# Patient Record
Sex: Male | Born: 1955 | Race: White | Hispanic: No | Marital: Married | Smoking: Never smoker
Health system: Southern US, Community
[De-identification: ages and names within clinical notes are randomized; demographics above are authoritative.]

## PROBLEM LIST (undated history)

## (undated) DIAGNOSIS — R519 Headache, unspecified: Secondary | ICD-10-CM

## (undated) DIAGNOSIS — Z87442 Personal history of urinary calculi: Secondary | ICD-10-CM

## (undated) DIAGNOSIS — N529 Male erectile dysfunction, unspecified: Secondary | ICD-10-CM

## (undated) DIAGNOSIS — M19041 Primary osteoarthritis, right hand: Secondary | ICD-10-CM

## (undated) DIAGNOSIS — E119 Type 2 diabetes mellitus without complications: Secondary | ICD-10-CM

## (undated) DIAGNOSIS — M48061 Spinal stenosis, lumbar region without neurogenic claudication: Secondary | ICD-10-CM

## (undated) DIAGNOSIS — G629 Polyneuropathy, unspecified: Secondary | ICD-10-CM

## (undated) DIAGNOSIS — I10 Essential (primary) hypertension: Secondary | ICD-10-CM

## (undated) DIAGNOSIS — E538 Deficiency of other specified B group vitamins: Secondary | ICD-10-CM

## (undated) DIAGNOSIS — M489 Spondylopathy, unspecified: Secondary | ICD-10-CM

## (undated) DIAGNOSIS — R739 Hyperglycemia, unspecified: Secondary | ICD-10-CM

## (undated) DIAGNOSIS — M19042 Primary osteoarthritis, left hand: Secondary | ICD-10-CM

## (undated) HISTORY — DX: Polyneuropathy, unspecified: G62.9

## (undated) HISTORY — DX: Primary osteoarthritis, right hand: M19.041

## (undated) HISTORY — DX: Hyperglycemia, unspecified: R73.9

## (undated) HISTORY — DX: Primary osteoarthritis, right hand: M19.042

## (undated) HISTORY — DX: Male erectile dysfunction, unspecified: N52.9

## (undated) HISTORY — DX: Type 2 diabetes mellitus without complications: E11.9

## (undated) HISTORY — DX: Deficiency of other specified B group vitamins: E53.8

## (undated) HISTORY — DX: Spondylopathy, unspecified: M48.9

## (undated) HISTORY — DX: Spinal stenosis, lumbar region without neurogenic claudication: M48.061

---

## 1998-12-07 ENCOUNTER — Encounter: Payer: Self-pay | Admitting: Emergency Medicine

## 1998-12-07 ENCOUNTER — Emergency Department (HOSPITAL_COMMUNITY): Admission: EM | Admit: 1998-12-07 | Discharge: 1998-12-07 | Payer: Self-pay | Admitting: Emergency Medicine

## 2008-08-27 HISTORY — PX: ANKLE SURGERY: SHX546

## 2008-08-30 ENCOUNTER — Other Ambulatory Visit: Payer: Self-pay

## 2008-08-30 ENCOUNTER — Ambulatory Visit: Payer: Self-pay

## 2011-07-22 ENCOUNTER — Emergency Department (HOSPITAL_COMMUNITY)
Admission: EM | Admit: 2011-07-22 | Discharge: 2011-07-22 | Disposition: A | Discharge status: Home / Self Care | Payer: PRIVATE HEALTH INSURANCE | Attending: Emergency Medicine | Admitting: Emergency Medicine

## 2011-07-22 ENCOUNTER — Emergency Department (HOSPITAL_COMMUNITY): Payer: PRIVATE HEALTH INSURANCE

## 2011-07-22 ENCOUNTER — Encounter (HOSPITAL_COMMUNITY): Payer: Self-pay | Admitting: Radiology

## 2011-07-22 DIAGNOSIS — K219 Gastro-esophageal reflux disease without esophagitis: Secondary | ICD-10-CM | POA: Insufficient documentation

## 2011-07-22 DIAGNOSIS — K5730 Diverticulosis of large intestine without perforation or abscess without bleeding: Secondary | ICD-10-CM | POA: Insufficient documentation

## 2011-07-22 DIAGNOSIS — R110 Nausea: Secondary | ICD-10-CM | POA: Insufficient documentation

## 2011-07-22 DIAGNOSIS — N200 Calculus of kidney: Secondary | ICD-10-CM | POA: Insufficient documentation

## 2011-07-22 DIAGNOSIS — I10 Essential (primary) hypertension: Secondary | ICD-10-CM | POA: Insufficient documentation

## 2011-07-22 DIAGNOSIS — R61 Generalized hyperhidrosis: Secondary | ICD-10-CM | POA: Insufficient documentation

## 2011-07-22 DIAGNOSIS — R1031 Right lower quadrant pain: Secondary | ICD-10-CM | POA: Insufficient documentation

## 2011-07-22 DIAGNOSIS — R109 Unspecified abdominal pain: Secondary | ICD-10-CM | POA: Insufficient documentation

## 2011-07-22 HISTORY — DX: Essential (primary) hypertension: I10

## 2011-07-22 LAB — URINALYSIS, ROUTINE W REFLEX MICROSCOPIC
Bilirubin Urine: NEGATIVE
Glucose, UA: NEGATIVE mg/dL
Ketones, ur: 15 mg/dL — AB
Leukocytes, UA: NEGATIVE
Nitrite: NEGATIVE
Protein, ur: 30 mg/dL — AB
Specific Gravity, Urine: 1.026 (ref 1.005–1.030)
Urobilinogen, UA: 0.2 mg/dL (ref 0.0–1.0)
pH: 5 (ref 5.0–8.0)

## 2011-07-22 LAB — CBC
HCT: 43.3 % (ref 39.0–52.0)
Hemoglobin: 15.1 g/dL (ref 13.0–17.0)
MCH: 30.1 pg (ref 26.0–34.0)
MCHC: 34.9 g/dL (ref 30.0–36.0)
MCV: 86.4 fL (ref 78.0–100.0)
Platelets: 222 10*3/uL (ref 150–400)
RBC: 5.01 MIL/uL (ref 4.22–5.81)
RDW: 12.9 % (ref 11.5–15.5)
WBC: 10.3 10*3/uL (ref 4.0–10.5)

## 2011-07-22 LAB — BASIC METABOLIC PANEL
BUN: 18 mg/dL (ref 6–23)
CO2: 22 mEq/L (ref 19–32)
Calcium: 9.5 mg/dL (ref 8.4–10.5)
Chloride: 105 mEq/L (ref 96–112)
Creatinine, Ser: 1.05 mg/dL (ref 0.50–1.35)
GFR calc Af Amer: >60 mL/min (ref >60)
GFR calc non Af Amer: >60 mL/min (ref >60)
Glucose, Bld: 86 mg/dL (ref 70–99)
Potassium: 4.2 mEq/L (ref 3.5–5.1)
Sodium: 137 mEq/L (ref 135–145)

## 2011-07-22 LAB — URINE MICROSCOPIC-ADD ON

## 2015-12-28 HISTORY — PX: CARPAL TUNNEL RELEASE: SHX101

## 2017-03-28 ENCOUNTER — Telehealth: Payer: Self-pay | Admitting: Neurology

## 2017-03-28 NOTE — Telephone Encounter (Signed)
Patient's wife stated they cancelled the apt in error 4/4 when the automated system called them. Gave them the next available but they would like a call back to get in sooner if someone cancels. Best number is [phone]

## 2017-03-29 NOTE — Telephone Encounter (Signed)
Returned call to patient - his appt has been scheduled to 03/31/17.

## 2017-03-30 ENCOUNTER — Ambulatory Visit: Payer: BLUE CROSS/BLUE SHIELD | Admitting: Neurology

## 2017-03-31 ENCOUNTER — Encounter: Payer: Self-pay | Admitting: *Deleted

## 2017-03-31 ENCOUNTER — Ambulatory Visit (INDEPENDENT_AMBULATORY_CARE_PROVIDER_SITE_OTHER): Payer: BLUE CROSS/BLUE SHIELD | Admitting: Neurology

## 2017-03-31 ENCOUNTER — Encounter: Payer: Self-pay | Admitting: Neurology

## 2017-03-31 ENCOUNTER — Encounter (INDEPENDENT_AMBULATORY_CARE_PROVIDER_SITE_OTHER): Payer: Self-pay

## 2017-03-31 DIAGNOSIS — R202 Paresthesia of skin: Secondary | ICD-10-CM

## 2017-03-31 DIAGNOSIS — R269 Unspecified abnormalities of gait and mobility: Secondary | ICD-10-CM | POA: Diagnosis not present

## 2017-03-31 DIAGNOSIS — M542 Cervicalgia: Secondary | ICD-10-CM

## 2017-03-31 DIAGNOSIS — M5442 Lumbago with sciatica, left side: Secondary | ICD-10-CM

## 2017-03-31 DIAGNOSIS — M545 Low back pain, unspecified: Secondary | ICD-10-CM | POA: Insufficient documentation

## 2017-03-31 DIAGNOSIS — G8929 Other chronic pain: Secondary | ICD-10-CM

## 2017-03-31 MED ORDER — DULOXETINE HCL 60 MG PO CPEP: 60.0000 mg | ORAL_CAPSULE | Freq: Every day | ORAL | 11 refills | Status: DC

## 2017-03-31 NOTE — Progress Notes (Signed)
PATIENT: Eric Nixon DOB: 09/08/1956  Chief Complaint  Patient presents with  . New Patient (Initial Visit)    Rm 4. Patient states that he has joint and muscle pain for years. recently has become worse. Patient is on the last day of taking steroids.      HISTORICAL  Eric Nixon is a 61 years old right-handed male, seen in refer by his primary care physician Dr.  Georgianne Nixon for evaluation of diffuse body muscle and joints pain, initial evaluation was on March 31 2017.  I reviewed and summarized the referring note, he had a history of obesity, hypertension, had a history of fall with left ankle fracture, require surgical fixation in 2009.  He works at [employer], did a lot of heavy lifting, around 2009 he also noticed gradual worsening low back pain, intermittent flareup of radiating pain to his left lower extremity, over the years he has increased the left ankle stiffness, left lateral 3 toes curled underneath, deformity of left foot, gradual onset mild gait abnormality,  In 2017 he had bilateral carpal tunnel release surgery due to first 3 finger paresthesia bilaterally, but the surgery did not help his symptoms much, he continues to have hands numbness, neck pain, radiating pain to bilateral shoulder, left shoulder pain, limited range of motion of left shoulder, difficulty buttoning due to clumsiness of both hands,  He denies bowel and bladder incontinence, mild gait abnormality, he also reported transient vertigo when he hyperextended his neck,  He was given a prescription of prednisone starting at 40 mg each day, reduce 10 mg every 3 days,He was also treated with diclofenac without significant improvement.  REVIEW OF SYSTEMS: Full 14 system review of systems performed and notable only for fatigue, snoring, joint pain, achy muscles, numbness, weakness, decreased energy  ALLERGIES: Allergies  Allergen Reactions  . Gabapentin Other (See Comments)    Causes  irritable mood    HOME MEDICATIONS: Current Outpatient Prescriptions  Medication Sig Dispense Refill  . diclofenac (VOLTAREN) 75 MG EC tablet Take 75 mg by mouth 2 (two) times daily.  3  . lisinopril-hydrochlorothiazide (PRINZIDE,ZESTORETIC) 20-12.5 MG tablet Take 1 tablet by mouth daily.  2   No current facility-administered medications for this visit.     PAST MEDICAL HISTORY: Past Medical History:  Diagnosis Date  . B12 deficiency   . ED (erectile dysfunction)   . Hyperglycemia   . Hypertension   . Osteoarthritis of both hands   . Peripheral neuropathy (HCC)     PAST SURGICAL HISTORY: Past Surgical History:  Procedure Laterality Date  . ANKLE SURGERY  08/2008    FAMILY HISTORY: Family History  Problem Relation Age of Onset  . Brain cancer Mother   . Bladder Cancer Father     SOCIAL HISTORY:  Social History   Social History  . Marital status: Married    Spouse name: N/A  . Number of children: 2  . Years of education: HS   Occupational History  . Owns Materials engineer   . Owns apartment   . Runs auction house    Social History Main Topics  . Smoking status: Never Smoker  . Smokeless tobacco: Never Used  . Alcohol use Yes     Comment: Rare  . Drug use: No  . Sexual activity: Not on file   Other Topics Concern  . Not on file   Social History Narrative   Lives at home with his wife.   Right-handed.  1 cup caffeine per day.     PHYSICAL EXAM   Vitals:   03/31/17 0858  BP: (!) 150/107  Pulse: 84  Resp: 16  Weight: 253 lb (114.8 kg)  Height:  (1.778 m)    Not recorded      Body mass index is 36.3 kg/m.  PHYSICAL EXAMNIATION:  Gen: NAD, conversant, well nourised, obese, well groomed                     Cardiovascular: Regular rate rhythm, no peripheral edema, warm, nontender. Eyes: Conjunctivae clear without exudates or hemorrhage Neck: Supple, no carotid bruits. Pulmonary: Clear to auscultation bilaterally   NEUROLOGICAL  EXAM:  MENTAL STATUS: Speech:    Speech is normal; fluent and spontaneous with normal comprehension.  Cognition:     Orientation to time, place and person     Normal recent and remote memory     Normal Attention span and concentration     Normal Language, naming, repeating,spontaneous speech     Fund of knowledge   CRANIAL NERVES: CN II: Visual fields are full to confrontation. Fundoscopic exam is normal with sharp discs and no vascular changes. Pupils are round equal and briskly reactive to light. CN III, IV, VI: extraocular movement are normal. No ptosis. CN V: Facial sensation is intact to pinprick in all 3 divisions bilaterally. Corneal responses are intact.  CN VII: Face is symmetric with normal eye closure and smile. CN VIII: Hearing is normal to rubbing fingers CN IX, X: Palate elevates symmetrically. Phonation is normal. CN XI: Head turning and shoulder shrug are intact CN XII: Tongue is midline with normal movements and no atrophy.  MOTOR: He has deformity of left foot, mild diffuse left toe extension flexion weakness, limited range of motion of left ankle, but there was no significant weakness of bilateral lower extremity, left upper extremity examination is limited due to left shoulder pain, mild left finger abduction weakness.  REFLEXES: Reflexes are 2+ and symmetric at the biceps, triceps, knees, and  absent at ankles. Plantar responses are flexor.  SENSORY: Decreased vibratory sensation at bilateral toes, length dependent decreased light touch pinprick to above ankle level  COORDINATION: Rapid alternating movements and fine finger movements are intact. There is no dysmetria on finger-to-nose and heel-knee-shin.    GAIT/STANCE: He needs push up to get up from seated position, dragging left leg some, difficulty standing up on left heels,   DIAGNOSTIC DATA (LABS, IMAGING, TESTING) - I reviewed patient records, labs, notes, testing and imaging myself where  available.   ASSESSMENT AND PLAN  Eric Nixon is a 61 y.o. male   Chronic low back pain radiating pain to left lower extremity  Consistent with left lumbar radiculopathy Chronic neck pain, continued bilateral hand paresthesia, left hand weakness despite bilateral carpal tunnel release surgery  Consistent with cervical radiculopathy  Proceed with MRI of cervical spine, lumbar spine  EMG nerve conduction study  Try to use NSAIDs only as needed, add on Cymbalta 60 mg daily   Levert Feinstein, M.D. Ph.D.  [facility] [address], [address] [place], [place] [address] Ph: [phone] Fax: [phone]  CC: Eric Fick, MD

## 2017-04-11 ENCOUNTER — Ambulatory Visit: Payer: BLUE CROSS/BLUE SHIELD | Admitting: Neurology

## 2017-04-13 ENCOUNTER — Ambulatory Visit (INDEPENDENT_AMBULATORY_CARE_PROVIDER_SITE_OTHER): Payer: BLUE CROSS/BLUE SHIELD

## 2017-04-13 DIAGNOSIS — R202 Paresthesia of skin: Secondary | ICD-10-CM | POA: Diagnosis not present

## 2017-04-13 DIAGNOSIS — R269 Unspecified abnormalities of gait and mobility: Secondary | ICD-10-CM | POA: Diagnosis not present

## 2017-04-13 DIAGNOSIS — M542 Cervicalgia: Secondary | ICD-10-CM

## 2017-04-13 DIAGNOSIS — G8929 Other chronic pain: Secondary | ICD-10-CM | POA: Diagnosis not present

## 2017-04-13 DIAGNOSIS — M5442 Lumbago with sciatica, left side: Secondary | ICD-10-CM

## 2017-04-20 ENCOUNTER — Telehealth: Payer: Self-pay | Admitting: Neurology

## 2017-04-20 NOTE — Telephone Encounter (Signed)
Please call patient MRI of the cervical spine showed multilevel degenerative changes, with evidence of foraminal stenosis most severe at C6-7, C7-T1, left C5-6, left C3-4,   MRI of lumbar spine also showed evidence of multilevel degenerative disc disease, with evidence of severe spinal stenosis at L 3-4, L 4 5, I will review MRI findings with him in detail at his next follow-up visit   Abnormal MRI cervical spine (without) demonstrating: 1. At C6-7, C7-T1: uncovertebral joint hypertrophy and facet hypertrophy with severe biforaminal stenosis  2. At C5-6: uncovertebral joint hypertrophy and facet hypertrophy with severe left foraminal stenosis  3. At C3-4: left facet hypertrophy with severe left foraminal stenosis 4. At C4-5: uncovertebral joint hypertrophy with mild left foraminal stenosis   IMPRESSION:  Abnormal MRI lumbar spine (without) demonstrating: 1. At L3-4, L4-5: disc bulging and facet hypertrophy / arthropathy with severe spinal stenosis and severe biforaminal stenosis  2. At L2-3: disc bulging and facet hypertrophy with moderate spinal stenosis and mild biforaminal stenosis  3. At L1-2: disc bulging and facet hypertrophy with mild spinal stenosis and mild biforaminal stenosis  4. At L3 and L4 there is anterior vertebral body edema. Posterior interspinous ligament edema at L3 and L4.

## 2017-04-21 NOTE — Telephone Encounter (Signed)
Spoke to patient - he is aware of results and will keep his appt on 05/06/17 for his NCV/EMG.

## 2017-04-21 NOTE — Telephone Encounter (Signed)
Left messages at both listed numbers requesting a return call.

## 2017-05-06 ENCOUNTER — Encounter (INDEPENDENT_AMBULATORY_CARE_PROVIDER_SITE_OTHER): Payer: Self-pay

## 2017-05-06 ENCOUNTER — Encounter (INDEPENDENT_AMBULATORY_CARE_PROVIDER_SITE_OTHER): Payer: Self-pay | Admitting: Neurology

## 2017-05-06 ENCOUNTER — Ambulatory Visit (INDEPENDENT_AMBULATORY_CARE_PROVIDER_SITE_OTHER): Payer: BLUE CROSS/BLUE SHIELD | Admitting: Neurology

## 2017-05-06 DIAGNOSIS — M5442 Lumbago with sciatica, left side: Secondary | ICD-10-CM

## 2017-05-06 DIAGNOSIS — R269 Unspecified abnormalities of gait and mobility: Secondary | ICD-10-CM | POA: Diagnosis not present

## 2017-05-06 DIAGNOSIS — M48061 Spinal stenosis, lumbar region without neurogenic claudication: Secondary | ICD-10-CM | POA: Insufficient documentation

## 2017-05-06 DIAGNOSIS — M542 Cervicalgia: Secondary | ICD-10-CM | POA: Diagnosis not present

## 2017-05-06 DIAGNOSIS — G8929 Other chronic pain: Secondary | ICD-10-CM | POA: Diagnosis not present

## 2017-05-06 DIAGNOSIS — R202 Paresthesia of skin: Secondary | ICD-10-CM | POA: Diagnosis not present

## 2017-05-06 DIAGNOSIS — Z0289 Encounter for other administrative examinations: Secondary | ICD-10-CM

## 2017-05-06 MED ORDER — CELECOXIB 100 MG PO CAPS: 100.0000 mg | ORAL_CAPSULE | Freq: Two times a day (BID) | ORAL | 11 refills | Status: DC

## 2017-05-06 NOTE — Patient Instructions (Signed)
[email or web address]   [address] [place], [place] [address] Phone: [phone]

## 2017-05-06 NOTE — Procedures (Signed)
Full Name: Eric GlatterJoel Bonnet Gender: Male MRN #: [id number] Date of Birth: 05-06-1956    Visit Date: 05/06/2017 08:25 Age: [age] Years 8 Months Old Examining Physician: Levert FeinsteinYijun Athziry Millican, MD  Referring Physician: Terrace ArabiaYan, MD History of present illness: 61 years old male, with history of chronic low back pain, neck pain, radiating to bilateral shoulder, history of bilateral carpal tunnel release surgery.  Summary of the tests:  Nerve conduction study:  Bilateral sural and superficial peroneal sensory responses showed borderline peak latency, normal snap amplitude. Bilateral ulnar sensory and motor responses were normal. Right median sensory responses were absent. Left median sensory response showed moderately to severely prolonged peak latency, with decreased snap amplitude. Bilateral radial sensory responses were normal.  Right median motor responses were absent. Left median motor responses showed mildly prolonged distal latency, with normal C map amplitude, conduction velocity.   Bilateral peroneal motor responses were normal. Left foot has deformity, has severely decreased left tibial C map amplitude. Right tibial motor responses showed mildly prolonged distal latency, with the normal range C map amplitude, and mildly decreased conduction velocity.  Electromyography:  Selective needle examinations were performed at bilateral upper and lower extremity muscles, bilateral cervical and lumbar sacral paraspinal muscles.  There is evidence of chronic neuropathic changes involving bilateral tibialis anterior, tibialis posterior, peroneal longus, medial gastrocnemius, vastus lateralis, left worse than right, there is also evidence of mild chronic neuropathic changes involving bilateral biceps, deltoid. There is evidence of chronic neuropathic changes involving bilateral abductor pollicis brevis, but there was no evidence of active generations.  Conclusion: This is an abnormal study. There is  electrodiagnostic evidence of chronic bilateral lumbosacral radiculopathy, mainly involving bilateral L4 L5 S1 myotomes, left worse than right. There is also evidence of chronic bilateral cervical radiculopathy, mainly involving bilateral C5-6 mild tones. There is bilateral carpal tunnel syndromes, severe on the left side, moderately severe on the right side, demyelinating in nature, there was no evidence of axonal loss.     ------------------------------- Levert FeinsteinYijun Shandelle Borrelli, M.D.  [facility] [address] [place], [place] [address] Tel: [phone] Fax: [phone]        [facility]    Nerve / Sites Rec. Site Latency Ref. Amplitude Ref. Rel Amp Segments Distance Velocity Ref. Area    ms ms mV mV %  cm m/s m/s mVms  L Median - APB     Wrist APB 5.1 ?4.4 6.2 ?4.0 100 Wrist - APB 7   21.4     Upper arm APB 9.8  5.5  89.2 Upper arm - Wrist 24 50 ?49 20.1  R Median - APB     Wrist APB NR ?4.4 NR ?4.0 NR Wrist - APB 7   NR     Upper arm APB NR  NR  NR Upper arm - Wrist   ?49 NR  L Ulnar - ADM     Wrist ADM 2.9 ?3.3 10.9 ?6.0 100 Wrist - ADM 7   36.9     B.Elbow ADM 7.4  9.9  90.9 B.Elbow - Wrist 22 48 ?49 35.2     A.Elbow ADM 10.1  9.6  97.3 A.Elbow - B.Elbow 12 45 ?49 34.5         A.Elbow - Wrist      R Ulnar - ADM     Wrist ADM 3.0 ?3.3 10.4 ?6.0 100 Wrist - ADM 7   33.1     B.Elbow ADM 7.6  9.2  88.4 B.Elbow -  Wrist 22 47 ?49 29.4     A.Elbow ADM 9.8  11.2  122 A.Elbow - B.Elbow 10 45 ?49 36.3  L Peroneal - EDB     Ankle EDB 5.6 ?6.5 3.6 ?2.0 100 Ankle - EDB 9   11.5     Fib head EDB 13.1  3.1  86.9 Fib head - Ankle 33 44 ?44 10.5     Pop fossa EDB 15.6  3.3  108 Pop fossa - Fib head 11 44 ?44 11.9         Pop fossa - Ankle      R Peroneal - EDB     Ankle EDB 5.7 ?6.5 6.5 ?2.0 100 Ankle - EDB 9   18.2     Fib head EDB 12.6  4.9  74.9 Fib head - Ankle 32 47 ?44 16.4     Pop fossa EDB 14.9  4.9  99.6 Pop fossa - Fib head 10 43 ?44 17.7         Pop fossa - Ankle      L Tibial  - AH     Ankle AH 6.4 ?5.8 0.6 ?4.0 100 Ankle - AH 9   1.5     Pop fossa AH 21.8  0.6  102 Pop fossa - Ankle 42 27 ?41   R Tibial - AH     Ankle AH 6.0 ?5.8 2.1 ?4.0 100 Ankle - AH 9   6.4     Pop fossa AH 17.7  1.1  53.8 Pop fossa - Ankle 42 36 ?41 4.1                     SNC    Nerve / Sites Rec. Site Peak Lat Ref.  Amp Ref. Segments Distance    ms ms V V  cm  L Radial - Anatomical snuff box (Forearm)     Forearm Wrist 2.66 ?2.90 17 ?15 Forearm - Wrist 10  R Radial - Anatomical snuff box (Forearm)     Forearm Wrist 2.86 ?2.90 9 ?15 Forearm - Wrist 10  L Sural - Ankle (Calf)     Calf Ankle 4.38 ?4.40 8 ?6 Calf - Ankle 14  R Sural - Ankle (Calf)     Calf Ankle 4.38 ?4.40 13 ?6 Calf - Ankle 14  L Superficial peroneal - Ankle     Lat leg Ankle 4.22 ?4.40 8 ?6 Lat leg - Ankle 14  R Superficial peroneal - Ankle     Lat leg Ankle 4.74 ?4.40 6 ?6 Lat leg - Ankle 14  L Median - Orthodromic (Dig II, Mid palm)     Dig II Wrist 5.05 ?3.40 5 ?10 Dig II - Wrist 13  R Median - Orthodromic (Dig II, Mid palm)     Dig II Wrist NR ?3.40 NR ?10 Dig II - Wrist 13  L Ulnar - Orthodromic, (Dig V, Mid palm)     Dig V Wrist 2.92 ?3.10 5 ?5 Dig V - Wrist 11  R Ulnar - Orthodromic, (Dig V, Mid palm)     Dig V Wrist 2.86 ?3.10 5 ?5 Dig V - Wrist 83                         F  Wave    Nerve F Lat Ref.   ms ms  L Tibial - AH 67.3 ?56.0  L Peroneal - EDB 47.0 ?56.0  L Ulnar -  ADM 33.9 ?32.0  R Ulnar - ADM 32.6 ?32.0  R Tibial - AH 61.8 ?56.0               EMG       EMG full       EMG Summary Table    Spontaneous MUAP Recruitment  Muscle IA Fib PSW Fasc Other Amp Dur. Poly Pattern  R. Tibialis anterior Normal None None None _______ Normal Normal Normal Reduced  R. Peroneus longus Normal None None None _______ Decreased Normal Normal Reduced  R. Tibialis posterior Normal None None None _______ Normal Normal 1+ Reduced  R. Gastrocnemius (Medial head) Normal None None None _______ Normal Normal  1+ Reduced  R. Vastus lateralis Normal None None None _______ Normal Normal Normal Reduced  L. Tibialis anterior Increased None None None _______ Normal Normal 1+ Reduced  L. Tibialis posterior Normal None None None _______ Normal Normal Normal Reduced  L. Peroneus longus Normal None None None _______ Normal Normal 1+ Reduced  L. Gastrocnemius (Medial head) Normal None None None _______ Normal Normal 1+ Reduced  L. Vastus medialis Normal None None None _______ Normal Normal Normal Reduced  L. Lumbar paraspinals (mid) Normal None None None _______ Normal Normal Normal Normal  L. Lumbar paraspinals (low) Normal None None None _______ Normal Normal Normal Normal  R. Lumbar paraspinals (mid) Normal None None None _______ Normal Normal Normal Normal  R. Lumbar paraspinals (low) Normal None None None _______ Normal Normal Normal Normal  R. First dorsal interosseous Normal None None None _______ Normal Normal Normal Reduced  R. Abductor pollicis brevis Normal None None None _______ Increased Normal 1+ Reduced  R. Pronator teres Normal None None None _______ Normal Normal Normal Normal  R. Deltoid Normal None None None _______ Normal Normal Normal Reduced  R. Biceps brachii Normal None None None _______ Normal Normal Normal Reduced  L. First dorsal interosseous Normal None None None _______ Normal Normal Normal Normal  L. Abductor pollicis brevis Normal None None None _______ Increased Normal Normal Reduced  L. Pronator teres Normal None None None _______ Normal Normal Normal Normal  L. Deltoid Normal None None None _______ Normal Normal Normal Reduced  L. Biceps brachii Normal None None None _______ Normal Normal Normal Reduced  L. Triceps brachii Normal None None None _______ Normal Normal Normal Normal  R. Cervical paraspinals Normal None None None _______ Normal Normal Normal Normal  L. Cervical paraspinals Normal None None None _______ Normal Normal Normal Normal

## 2017-05-06 NOTE — Progress Notes (Signed)
PATIENT: Eric Nixon DOB: 06/17/1956  No chief complaint on file.    HISTORICAL  Eric ShutterJoel T Habel is a 61 years old right-handed male, seen in refer by his primary care physician Dr.  Georgianne FickAjith Ramachandran for evaluation of diffuse body muscle and joints pain, initial evaluation was on March 31 2017.  I reviewed and summarized the referring note, he had a history of obesity, hypertension, had a history of fall with left ankle fracture, require surgical fixation in 2009.  He works at [employer], did a lot of heavy lifting, around 2009 he also noticed gradual worsening low back pain, intermittent flareup of radiating pain to his left lower extremity, over the years he has increased the left ankle stiffness, left lateral 3 toes curled underneath, deformity of left foot, gradual onset mild gait abnormality,  In 2017 he had bilateral carpal tunnel release surgery due to first 3 finger paresthesia bilaterally, but the surgery did not help his symptoms much, he continues to have hands numbness, neck pain, radiating pain to bilateral shoulder, left shoulder pain, limited range of motion of left shoulder, difficulty buttoning due to clumsiness of both hands,  He denies bowel and bladder incontinence, mild gait abnormality, he also reported transient vertigo when he hyperextended his neck,  He was given a prescription of prednisone starting at 40 mg each day, reduce 10 mg every 3 days,He was also treated with diclofenac without significant improvement.  UPDATE May 06 2017: He returned for electrodiagnostic study today, which has demonstrate chronic bilateral lumbosacral radiculopathy, left worse than right, mainly involving bilateral L4-5, S1 nerve roots. Also evidence of slight bilateral cervical radiculopathy, involving bilateral C5 VI nerve roots. Severe right-sided carpal tunnel, moderate severe left-sided carpal tunnel.  We have personally reviewed MRI lumbar in April 2018, multilevel  degenerative disc disease, most severe at L3-4, L4-5, with severe spinal stenosis, severe bilateral foraminal stenosis.  MRI of cervical spine, multilevel degenerative disc disease, most severe at C6-7, C7-T1, with severe foraminal stenosis, and severe left foraminal stenosis at C5-6.  He did not try Cymbalta, complains of 10 out of 10 low back pain radiating to bilateral buttocks sometimes, lasting all day denies bowel and bladder incontinence, gait abnormality,  REVIEW OF SYSTEMS: Full 14 system review of systems performed and notable only for as above ALLERGIES: Allergies  Allergen Reactions  . Gabapentin Other (See Comments)    Causes irritable mood    HOME MEDICATIONS: Current Outpatient Prescriptions  Medication Sig Dispense Refill  . diclofenac (VOLTAREN) 75 MG EC tablet Take 75 mg by mouth 2 (two) times daily.  3  . DULoxetine (CYMBALTA) 60 MG capsule Take 1 capsule (60 mg total) by mouth daily. 30 capsule 11  . lisinopril-hydrochlorothiazide (PRINZIDE,ZESTORETIC) 20-12.5 MG tablet Take 1 tablet by mouth daily.  2   No current facility-administered medications for this visit.     PAST MEDICAL HISTORY: Past Medical History:  Diagnosis Date  . B12 deficiency   . ED (erectile dysfunction)   . Hyperglycemia   . Hypertension   . Osteoarthritis of both hands   . Peripheral neuropathy     PAST SURGICAL HISTORY: Past Surgical History:  Procedure Laterality Date  . ANKLE SURGERY  08/2008    FAMILY HISTORY: Family History  Problem Relation Age of Onset  . Brain cancer Mother   . Bladder Cancer Father     SOCIAL HISTORY:  Social History   Social History  . Marital status: Married  Spouse name: N/A  . Number of children: 2  . Years of education: HS   Occupational History  . Owns Materials engineer   . Owns apartment   . Runs auction house    Social History Main Topics  . Smoking status: Never Smoker  . Smokeless tobacco: Never Used  . Alcohol use Yes      Comment: Rare  . Drug use: No  . Sexual activity: Not on file   Other Topics Concern  . Not on file   Social History Narrative   Lives at home with his wife.   Right-handed.   1 cup caffeine per day.     PHYSICAL EXAM   There were no vitals filed for this visit.  Not recorded      There is no height or weight on file to calculate BMI.  PHYSICAL EXAMNIATION:  Gen: NAD, conversant, well nourised, obese, well groomed                     Cardiovascular: Regular rate rhythm, no peripheral edema, warm, nontender. Eyes: Conjunctivae clear without exudates or hemorrhage Neck: Supple, no carotid bruits. Pulmonary: Clear to auscultation bilaterally   NEUROLOGICAL EXAM:  MENTAL STATUS: Speech:    Speech is normal; fluent and spontaneous with normal comprehension.  Cognition:     Orientation to time, place and person     Normal recent and remote memory     Normal Attention span and concentration     Normal Language, naming, repeating,spontaneous speech     Fund of knowledge   CRANIAL NERVES: CN II: Visual fields are full to confrontation. Fundoscopic exam is normal with sharp discs and no vascular changes. Pupils are round equal and briskly reactive to light. CN III, IV, VI: extraocular movement are normal. No ptosis. CN V: Facial sensation is intact to pinprick in all 3 divisions bilaterally. Corneal responses are intact.  CN VII: Face is symmetric with normal eye closure and smile. CN VIII: Hearing is normal to rubbing fingers CN IX, X: Palate elevates symmetrically. Phonation is normal. CN XI: Head turning and shoulder shrug are intact CN XII: Tongue is midline with normal movements and no atrophy.  MOTOR: He has deformity of left foot, mild diffuse left toe extension flexion weakness, limited range of motion of left ankle, but there was no significant weakness of bilateral lower extremity, left upper extremity examination is limited due to left shoulder pain, mild left  finger abduction weakness.  REFLEXES: Reflexes are 2+ and symmetric at the biceps, triceps, knees, and  absent at ankles. Plantar responses are flexor.  SENSORY: Decreased vibratory sensation at bilateral toes, length dependent decreased light touch pinprick to above ankle level  COORDINATION: Rapid alternating movements and fine finger movements are intact. There is no dysmetria on finger-to-nose and heel-knee-shin.    GAIT/STANCE: He needs push up to get up from seated position, dragging left leg some, difficulty standing up on left heels,   DIAGNOSTIC DATA (LABS, IMAGING, TESTING) - I reviewed patient records, labs, notes, testing and imaging myself where available.   ASSESSMENT AND PLAN  BRENNIN DURFEE is a 61 y.o. male   Lumbar stenosis,  With severe low back pain, gait abnormality,  We will refer him to neurosurgeon for evaluation  Cymbalta 60 mg a day  Celebrex 100 twice a day  Continue moderate exercise Chronic neck pain, cervical radiculopathy Bilateral carpal tunnel syndromes, right-sided severe, left-sided is moderate  I have suggested wrist splint  Levert Feinstein, M.D. Ph.D.  [facility] [address] [place], [place] [address] Ph: [phone] Fax: [phone]  CC: Georgianne Fick, MD

## 2017-05-17 ENCOUNTER — Telehealth: Payer: Self-pay | Admitting: Neurology

## 2017-05-17 NOTE — Telephone Encounter (Signed)
Neurosurgery has tried to reach out to patient.  Patient relayed he does not want to schedule an appointment at this time.

## 2017-05-17 NOTE — Telephone Encounter (Signed)
Reviewed, he has a follow up appt with me in Nov 2018

## 2017-11-07 ENCOUNTER — Encounter: Payer: Self-pay | Admitting: Neurology

## 2017-11-07 ENCOUNTER — Ambulatory Visit: Payer: BLUE CROSS/BLUE SHIELD | Admitting: Neurology

## 2017-11-07 VITALS — BP 151/93 | HR 92 | Ht 70.0 in | Wt 257.0 lb

## 2017-11-07 DIAGNOSIS — G8929 Other chronic pain: Secondary | ICD-10-CM

## 2017-11-07 DIAGNOSIS — M5442 Lumbago with sciatica, left side: Secondary | ICD-10-CM

## 2017-11-07 DIAGNOSIS — M48061 Spinal stenosis, lumbar region without neurogenic claudication: Secondary | ICD-10-CM

## 2017-11-07 DIAGNOSIS — R269 Unspecified abnormalities of gait and mobility: Secondary | ICD-10-CM

## 2017-11-07 NOTE — Patient Instructions (Signed)
Epidural injection?

## 2017-11-07 NOTE — Progress Notes (Signed)
PATIENT: Eric Nixon DOB: [birth year]  Chief Complaint  Patient presents with  . Lumbar/Cervical Pain    Reports his low back and neck pain are unchanged.  He decided against being evaluated by neurosurgery.     HISTORICAL  Eric Nixon is a 61 years old right-handed male, seen in refer by his primary care physician Dr.  Georgianne Fick for evaluation of diffuse body muscle and joints pain, initial evaluation was on March 31 2017.  I reviewed and summarized the referring note, he had a history of obesity, hypertension, had a history of fall with left ankle fracture, require surgical fixation in 2009.  He works at [employer], did a lot of heavy lifting, around 2009 he also noticed gradual worsening low back pain, intermittent flareup of radiating pain to his left lower extremity, over the years he has increased the left ankle stiffness, left lateral 3 toes curled underneath, deformity of left foot, gradual onset mild gait abnormality,  In 2017 he had bilateral carpal tunnel release surgery due to first 3 finger paresthesia bilaterally, but the surgery did not help his symptoms much, he continues to have hands numbness, neck pain, radiating pain to bilateral shoulder, left shoulder pain, limited range of motion of left shoulder, difficulty buttoning due to clumsiness of both hands,  He denies bowel and bladder incontinence, mild gait abnormality, he also reported transient vertigo when he hyperextended his neck,  He was given a prescription of prednisone starting at 40 mg each day, reduce 10 mg every 3 days,He was also treated with diclofenac without significant improvement.  UPDATE May 06 2017: He returned for electrodiagnostic study today, which has demonstrate chronic bilateral lumbosacral radiculopathy, left worse than right, mainly involving bilateral L4-5, S1 nerve roots. Also evidence of slight bilateral cervical radiculopathy, involving bilateral C5 VI nerve  roots. Severe right-sided carpal tunnel, moderate severe left-sided carpal tunnel.  We have personally reviewed MRI lumbar in April severe multilevel degenerative disc disease, most severe at L3-4, L4-5, with severe spinal stenosis, severe bilateral foraminal stenosis.  MRI of cervical spine, multilevel degenerative disc disease, most severe at C6-7, C7-T1, with severe foraminal stenosis, and severe left foraminal stenosis at C5-6.  He did not try Cymbalta, complains of 10 out of 10 low back pain radiating to bilateral buttocks sometimes, lasting all day, denies bowel and bladder incontinence, gait abnormality,  UPDATE Nov 07 2017: He still has intermittent bilateral hands numbness and, tingling, he still has significant low back pain, getting worsen bearing weight, if he move certain way, he has neck stiffness. He has difficulty buttoning the shirt.  He has taken celebrex bid for 6 months, which does help his low back pain,  We again reviewed MRI lumbar in April 2018, severe lumbar stenosis at L3-4, L4-5, with severe bilateral foraminal narrowing  REVIEW OF SYSTEMS: Full 14 system review of systems performed and notable only for as above ALLERGIES: Allergies  Allergen Reactions  . Gabapentin Other (See Comments)    Causes irritable mood    HOME MEDICATIONS: Current Outpatient Medications  Medication Sig Dispense Refill  . celecoxib (CELEBREX) 100 MG capsule Take 1 capsule (100 mg total) by mouth 2 (two) times daily. 60 capsule 11  . lisinopril-hydrochlorothiazide (PRINZIDE,ZESTORETIC) 20-12.5 MG tablet Take 1 tablet by mouth daily.  2   No current facility-administered medications for this visit.     PAST MEDICAL HISTORY: Past Medical History:  Diagnosis Date  . B12 deficiency   .  ED (erectile dysfunction)   . Hyperglycemia   . Hypertension   . Osteoarthritis of both hands   . Peripheral neuropathy     PAST SURGICAL HISTORY: Past Surgical History:  Procedure Laterality  Date  . ANKLE SURGERY  08/2008    FAMILY HISTORY: Family History  Problem Relation Age of Onset  . Brain cancer Mother   . Bladder Cancer Father     SOCIAL HISTORY:  Social History   Socioeconomic History  . Marital status: Married    Spouse name: Not on file  . Number of children: 2  . Years of education: HS  . Highest education level: Not on file  Social Needs  . Financial resource strain: Not on file  . Food insecurity - worry: Not on file  . Food insecurity - inability: Not on file  . Transportation needs - medical: Not on file  . Transportation needs - non-medical: Not on file  Occupational History  . Occupation: Owns Materials engineerfurniture store  . Occupation: Owns apartment  . Occupation: Runs auction house  Tobacco Use  . Smoking status: Never Smoker  . Smokeless tobacco: Never Used  Substance and Sexual Activity  . Alcohol use: Yes    Comment: Rare  . Drug use: No  . Sexual activity: Not on file  Other Topics Concern  . Not on file  Social History Narrative   Lives at home with his wife.   Right-handed.   1 cup caffeine per day.     PHYSICAL EXAM   Vitals:   11/07/17 0824  BP: (!) 151/93  Pulse: 92  Weight: 257 lb (116.6 kg)  Height: 5\' 10"  (1.778 m)    Not recorded      Body mass index is 36.88 kg/m.  PHYSICAL EXAMNIATION:  Gen: NAD, conversant, well nourised, obese, well groomed                     Cardiovascular: Regular rate rhythm, no peripheral edema, warm, nontender. Eyes: Conjunctivae clear without exudates or hemorrhage Neck: Supple, no carotid bruits. Pulmonary: Clear to auscultation bilaterally   NEUROLOGICAL EXAM:  MENTAL STATUS: Speech:    Speech is normal; fluent and spontaneous with normal comprehension.  Cognition:     Orientation to time, place and person     Normal recent and remote memory     Normal Attention span and concentration     Normal Language, naming, repeating,spontaneous speech     Fund of knowledge     CRANIAL NERVES: CN II: Visual fields are full to confrontation. Fundoscopic exam is normal with sharp discs and no vascular changes. Pupils are round equal and briskly reactive to light. CN III, IV, VI: extraocular movement are normal. No ptosis. CN V: Facial sensation is intact to pinprick in all 3 divisions bilaterally. Corneal responses are intact.  CN VII: Face is symmetric with normal eye closure and smile. CN VIII: Hearing is normal to rubbing fingers CN IX, X: Palate elevates symmetrically. Phonation is normal. CN XI: Head turning and shoulder shrug are intact CN XII: Tongue is midline with normal movements and no atrophy.  MOTOR: He has deformity of left foot, mild diffuse left toe extension flexion weakness, limited range of motion of left ankle, but there was no significant weakness of bilateral lower extremity, left upper extremity examination is limited due to left shoulder pain, mild left finger abduction weakness.  REFLEXES: Reflexes are 2+ and symmetric at the biceps, triceps, knees, and  absent at  ankles. Plantar responses are flexor.  SENSORY: Decreased vibratory sensation at bilateral toes, length dependent decreased light touch pinprick to above ankle level  COORDINATION: Rapid alternating movements and fine finger movements are intact. There is no dysmetria on finger-to-nose and heel-knee-shin.    GAIT/STANCE: He needs push up to get up from seated position, dragging left leg some, difficulty standing up on left heels,   DIAGNOSTIC DATA (LABS, IMAGING, TESTING) - I reviewed patient records, labs, notes, testing and imaging myself where available.   ASSESSMENT AND PLAN  Lupita ShutterJoel T Williard is a 61 y.o. male   Lumbar stenosis,  With severe low back pain, gait abnormality  Cymbalta 60 mg a day  Celebrex 100 twice a day  Continue moderate exercise  He does not want to consider surgical evaluation at this point Chronic neck pain, cervical radiculopathy Bilateral  carpal tunnel syndromes, right-sided severe, left-sided is moderate  I have suggested wrist splint  Levert FeinsteinYijun Taji Sather, M.D. Ph.D.  [facility] [address], [address] [place], [place] [address] Ph: [phone] Fax: [phone]  CC: Georgianne FickAjith Ramachandran, MD

## 2018-08-15 ENCOUNTER — Telehealth: Payer: Self-pay | Admitting: Internal Medicine

## 2018-08-15 NOTE — Telephone Encounter (Signed)
Pt's friend Verlan FriendsBarry Racker recommended you. Will you accept as a new patient?

## 2018-08-16 NOTE — Telephone Encounter (Signed)
Yes, 30min OV when possible.  Thanks.  

## 2018-08-17 NOTE — Telephone Encounter (Signed)
I left msg for pt to get scheduled as a new patient in October or after.

## 2018-10-13 ENCOUNTER — Ambulatory Visit: Payer: BLUE CROSS/BLUE SHIELD | Admitting: Family Medicine

## 2018-10-13 ENCOUNTER — Encounter: Payer: Self-pay | Admitting: Family Medicine

## 2018-10-13 VITALS — BP 124/84 | HR 96 | Temp 98.4°F | Ht 70.0 in | Wt 254.1 lb

## 2018-10-13 DIAGNOSIS — M542 Cervicalgia: Secondary | ICD-10-CM | POA: Diagnosis not present

## 2018-10-13 DIAGNOSIS — Z23 Encounter for immunization: Secondary | ICD-10-CM | POA: Diagnosis not present

## 2018-10-13 DIAGNOSIS — I10 Essential (primary) hypertension: Secondary | ICD-10-CM

## 2018-10-13 DIAGNOSIS — M48061 Spinal stenosis, lumbar region without neurogenic claudication: Secondary | ICD-10-CM | POA: Diagnosis not present

## 2018-10-13 DIAGNOSIS — R202 Paresthesia of skin: Secondary | ICD-10-CM | POA: Diagnosis not present

## 2018-10-13 DIAGNOSIS — M4800 Spinal stenosis, site unspecified: Secondary | ICD-10-CM

## 2018-10-13 MED ORDER — DICLOFENAC SODIUM 75 MG PO TBEC: 75.0000 mg | DELAYED_RELEASE_TABLET | Freq: Two times a day (BID) | ORAL | Status: DC | PRN

## 2018-10-13 NOTE — Progress Notes (Signed)
New patient to establish care.    I was able to review some but not all of his old records at the time of the office visit.  He provided additional old records and I will review those.    Hypertension:    Using medication without problems or lightheadedness: yes Chest pain with exertion:no Edema:no Short of breath:no  Was prev on diclofenac vs celebrex.  Diclofenac helped more prev.  Seen by neurology prev.  He has B hand pain with B 3rd-5th finger numbness.  No help with carpal tunnel surgery.    He is taking aspirin for back pain.  Pain is in the lower back/buttock.  He has noted subjective muscle loss in the ext x4.  His balance isn't as good as prev per his report.  He has pain on standing that is better sitting.   No B/B sx.    ==============================================  Prev EMG: This is an abnormal study. There is electrodiagnostic evidence of chronic bilateral lumbosacral radiculopathy, mainly involving bilateral L4 L5 S1 myotomes, left worse than right. There is also evidence of chronic bilateral cervical radiculopathy, mainly involving bilateral C5-6 mild tones. There is bilateral carpal tunnel syndromes, severe on the left side, moderately severe on the right side, demyelinating in nature, there was no evidence of axonal loss.   Abnormal MRI cervical spine (without) demonstrating: 1. At C6-7, C7-T1: uncovertebral joint hypertrophy and facet hypertrophy with severe biforaminal stenosis  2. At C5-6: uncovertebral joint hypertrophy and facet hypertrophy with severe left foraminal stenosis  3. At C3-4: left facet hypertrophy with severe left foraminal stenosis 4. At C4-5: uncovertebral joint hypertrophy with mild left foraminal stenosis   Abnormal MRI lumbar spine (without) demonstrating: 1. At L3-4, L4-5: disc bulging and facet hypertrophy / arthropathy with severe spinal stenosis and severe biforaminal stenosis  2. At L2-3: disc bulging and facet hypertrophy with moderate  spinal stenosis and mild biforaminal stenosis  3. At L1-2: disc bulging and facet hypertrophy with mild spinal stenosis and mild biforaminal stenosis  4. At L3 and L4 there is anterior vertebral body edema. Posterior interspinous ligament edema at L3 and L4.  ==============================================  Meds, vitals, and allergies reviewed.   PMH and SH reviewed  ROS: Per HPI unless specifically indicated in ROS section   GEN: nad, alert and oriented HEENT: mucous membranes moist NECK: supple w/o LA CV: rrr. PULM: ctab, no inc wob ABD: soft, +bs EXT: no edema SKIN: no acute rash Strength wnl grip but dec sensation to monofilament on B 2nd and 3rd fingers.  Normal plantar and dorsiflexion B feet but dec sensation monofilament B feet.  L foot with chronic arthritic changes.   CN 2-12 wnl B o/w.

## 2018-10-13 NOTE — Patient Instructions (Addendum)
We will call about your referral.  Shirlee Limerick or Alvina Chou will call you if you don't see one of them on the way out.  Go to the lab on the way out.  We'll contact you with your lab report. Stop aspirin.   Take diclofenac with food (with tylenol) as needed.  Take care.  Glad to see you.

## 2018-10-14 LAB — CBC WITH DIFFERENTIAL/PLATELET
BASOS ABS: 82 {cells}/uL (ref 0–200)
Basophils Relative: 0.9 %
EOS PCT: 2.7 %
Eosinophils Absolute: 246 cells/uL (ref 15–500)
HCT: 46 % (ref 38.5–50.0)
Hemoglobin: 16.1 g/dL (ref 13.2–17.1)
Lymphs Abs: 2066 cells/uL (ref 850–3900)
MCH: 32.1 pg (ref 27.0–33.0)
MCHC: 35 g/dL (ref 32.0–36.0)
MCV: 91.6 fL (ref 80.0–100.0)
MONOS PCT: 7.5 %
MPV: 11.2 fL (ref 7.5–12.5)
NEUTROS PCT: 66.2 %
Neutro Abs: 6024 cells/uL (ref 1500–7800)
Platelets: 353 10*3/uL (ref 140–400)
RBC: 5.02 10*6/uL (ref 4.20–5.80)
RDW: 12.1 % (ref 11.0–15.0)
Total Lymphocyte: 22.7 %
WBC mixed population: 683 cells/uL (ref 200–950)
WBC: 9.1 10*3/uL (ref 3.8–10.8)

## 2018-10-14 LAB — VITAMIN B12: Vitamin B-12: 280 pg/mL (ref 200–1100)

## 2018-10-14 LAB — COMPREHENSIVE METABOLIC PANEL
AG Ratio: 1.8 (calc) (ref 1.0–2.5)
ALBUMIN MSPROF: 4.6 g/dL (ref 3.6–5.1)
ALT: 39 U/L (ref 9–46)
AST: 32 U/L (ref 10–35)
Alkaline phosphatase (APISO): 75 U/L (ref 40–115)
BUN/Creatinine Ratio: 16 (calc) (ref 6–22)
BUN: 25 mg/dL (ref 7–25)
CO2: 22 mmol/L (ref 20–32)
CREATININE: 1.57 mg/dL — AB (ref 0.70–1.25)
Calcium: 10.5 mg/dL — ABNORMAL HIGH (ref 8.6–10.3)
Chloride: 104 mmol/L (ref 98–110)
Globulin: 2.6 g/dL (calc) (ref 1.9–3.7)
Glucose, Bld: 82 mg/dL (ref 65–99)
POTASSIUM: 4.2 mmol/L (ref 3.5–5.3)
Sodium: 140 mmol/L (ref 135–146)
TOTAL PROTEIN: 7.2 g/dL (ref 6.1–8.1)
Total Bilirubin: 0.3 mg/dL (ref 0.2–1.2)

## 2018-10-14 LAB — TSH: TSH: 1.09 m[IU]/L (ref 0.40–4.50)

## 2018-10-15 ENCOUNTER — Other Ambulatory Visit: Payer: Self-pay | Admitting: Family Medicine

## 2018-10-15 ENCOUNTER — Encounter: Payer: Self-pay | Admitting: Family Medicine

## 2018-10-15 DIAGNOSIS — R7989 Other specified abnormal findings of blood chemistry: Secondary | ICD-10-CM

## 2018-10-15 DIAGNOSIS — I10 Essential (primary) hypertension: Secondary | ICD-10-CM | POA: Insufficient documentation

## 2018-10-15 NOTE — Assessment & Plan Note (Signed)
No change in meds at this point.  Check routine labs today.  Will review old records.  Okay for outpatient follow-up.

## 2018-10-15 NOTE — Assessment & Plan Note (Signed)
See discussion of lumbar spinal stenosis.

## 2018-10-15 NOTE — Assessment & Plan Note (Signed)
He has previously documented abnormal imaging with previous neurology evaluation noted.  I am seeing him for the first time today.  We discussed the pathophysiology of spinal stenosis and also the degenerative changes noted in his neck.    Reasonable to recheck routine labs related to neuropathy, see orders.  Refer to neurosurgery.  Routine cautions given.  Rationale for referral discussed.  Unclear if he would benefit from injection.  The goal would be to avoid surgery but he may end up needing that.  All questions answered to the best of my ability.  At this point still okay for outpatient follow-up.  Would limit taking aspirin in the meantime.  He can try taken diclofenac with food.  He can take diclofenac with Tylenol if needed for pain.  He will update me as needed.  >45 minutes spent in face to face time with patient, >50% spent in counselling or coordination of care.

## 2018-10-20 ENCOUNTER — Other Ambulatory Visit (INDEPENDENT_AMBULATORY_CARE_PROVIDER_SITE_OTHER): Payer: BLUE CROSS/BLUE SHIELD

## 2018-10-20 DIAGNOSIS — R7989 Other specified abnormal findings of blood chemistry: Secondary | ICD-10-CM

## 2018-10-20 LAB — BASIC METABOLIC PANEL
BUN: 19 mg/dL (ref 6–23)
CALCIUM: 9.8 mg/dL (ref 8.4–10.5)
CO2: 26 mEq/L (ref 19–32)
CREATININE: 1.22 mg/dL (ref 0.40–1.50)
Chloride: 103 mEq/L (ref 96–112)
GFR: 63.73 mL/min (ref >60.00)
Glucose, Bld: 93 mg/dL (ref 70–99)
Potassium: 4 mEq/L (ref 3.5–5.1)
Sodium: 137 mEq/L (ref 135–145)

## 2018-11-26 ENCOUNTER — Other Ambulatory Visit: Payer: Self-pay | Admitting: Family Medicine

## 2018-11-26 DIAGNOSIS — M19042 Primary osteoarthritis, left hand: Principal | ICD-10-CM

## 2018-11-26 DIAGNOSIS — E538 Deficiency of other specified B group vitamins: Secondary | ICD-10-CM

## 2018-11-26 DIAGNOSIS — R739 Hyperglycemia, unspecified: Secondary | ICD-10-CM | POA: Insufficient documentation

## 2018-11-26 DIAGNOSIS — M19041 Primary osteoarthritis, right hand: Secondary | ICD-10-CM

## 2018-11-26 NOTE — Progress Notes (Signed)
Call pt.  Old records reviewed.    1. When will he be seeing neurosurgery?  How is he doing in terms of his pain control?  2. Did he ever have a colonoscopy or any other form of colon cancer screening?  If so, then let me know.  If not, and if he is interested in any form of screening then please let me know.   This can be done after he sees neurosurgery.  3. Old records mentioned possible sleep study.  Has this been done?  If he still snoring and having significant fatigue it may be reasonable to have him see pulmonary about this.  This can be done after he sees neurosurgery.  4. I do not see previous HIV screening in his old records.  If he has had it done, or if he has given blood at the [facility]/had this done with insurance, then let me know so I can update his chart.    Please ask about #1 through 4 above and let me know.  Please enter negative hepatitis C in EMR, <0.1, 07/04/2014 Please enter Tdap in EMR, 02/25/2015.  11/27/18  6:01 PM 1) Patient says he will be seeing Neurosurgery after the first of the year.  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  Patient says his pain is terrible in his fingers, wrists and elbows but ironically, no as bad in his back for right now. 2) Patient states he has never had a colonoscopy and refuses to do so.  He has done stool cards in the past with negative results.  He is willing to do Cologuard if he needs to. 3) Has never had sleep study.  It was mentioned before he left the previous practice and he is willing to do it, again, after the first of the year.  Patient's wife says he is still snoring but patient says he does not feel fatigued or tired when he wakes up or during the day. 4) Patient has not had an HIV screening specifically but has given blood through the [facility] many times, the last being about 2 years ago.  Patient says he has not donated in a while because of taking  anti-inflammatories but that the [facility] is constantly contacting him to donate again. Ninetta LightsL. [place], CMA  11/27/18  6:10 PM  Thanks for checking on him.   1.  If he needs to try rx for tramadol for joint pain, then let me know.   2.  If he has a positive IFOB or cologuard, then the rec would be to do a colonoscopy.  If he is going to refuse colonoscopy at that point, then there is no point in doing either to begin with.  If he is willing to proceed with colonoscopy if cologuard is positive,then reasonable to order cologuard. I can order if needed.   3. Have him let me know if he is willing to go through with the sleep study after the first of the year, esp if snoring continues.   4. Chart updated.     Thanks.   Crawford GivensGraham Duncan 6:14 AM 11/28/18   11/28/2018   9:00 AM Patient advised and would like Tramadol [facility] take [facility] [address]   =============== Sent. Thanks.  Let me know if he wants to go through with cologuard, as above.   11/28/2018 11:31 AM Patient states  he will not do colonoscopy and according to your instructions above, then nothing is to be ordered.  Ninetta Lights, CMA  ================= 11/29/18 12:30 PM Noted.  Thanks.  If the patient is going to refuse colonoscopy under any circumstances, then it does not make sense to proceed with ifob or Cologuard as a positive or negative with either would not change the plan.

## 2018-11-27 ENCOUNTER — Other Ambulatory Visit: Payer: Self-pay | Admitting: *Deleted

## 2018-11-27 ENCOUNTER — Encounter: Payer: Self-pay | Admitting: Family Medicine

## 2018-11-27 LAB — LAB REPORT - SCANNED: HEP C AB: NEGATIVE

## 2018-11-28 ENCOUNTER — Telehealth: Payer: Self-pay | Admitting: *Deleted

## 2018-11-28 MED ORDER — TRAMADOL HCL 50 MG PO TABS: 50.0000 mg | ORAL_TABLET | Freq: Two times a day (BID) | ORAL | 1 refills | Status: DC | PRN

## 2018-11-28 NOTE — Telephone Encounter (Signed)
PA submitted for Tramadol thru [facility], awaiting response.

## 2018-11-30 ENCOUNTER — Encounter: Payer: Self-pay | Admitting: Family Medicine

## 2018-11-30 LAB — LAB REPORT - SCANNED
A1C: 5.8
A1C: 5.8
A1C: 6
A1c: 5.9
ALT: 49
AST: 39
AST: 41
CCP Antibodies IgG/IgA: 6
CHOLESTEROL: 191
CREATININE: 1.3
CRP: 0.9
Cholesterol: 211
Cholesterol: 215
Cholesterol: 233
Creatinine, Ser: 1.1
Creatinine, Ser: 1.2
Creatinine, Ser: 1.4
GLUCOSE: 123
Glucose: 108
Glucose: 122
Glucose: 128
HDL: 51
HDL: 55
HDL: 58
HDL: 54
HEMOGLOBIN: 15
HEMOGLOBIN: 16.6
HEP C AB: NEGATIVE
Hemoglobin: 14.2
Hemoglobin: 14.6
LDL (calc): 108
LDL CALC: 127
LDL CALC: 129
LDL CALC: 152
PROSTATE SPECIFIC AG, SERUM: 0.84
PROSTATE SPECIFIC AG, SERUM: 0.96
Prostate Specific Ag, Serum: 1.09
Prostate Specific Ag, Serum: 1.12
SED RATE BY MODIFIED WESTERGREN,MANUAL: 0
SED RATE BY MODIFIED WESTERGREN,MANUAL: 0
SGOT(AST): 36
SGOT(AST): 42
SGPT (ALT): 56
SGPT (ALT): 64
SGPT (ALT): 71
TRIGLYCERIDES: 149 (ref 40–160)
TRIGLYCERIDES: 137 (ref 40–160)
TSH: 1.49
TSH: 1.68
Triglycerides: 141 (ref 40–160)
Triglycerides: 154 (ref 40–160)
VITAMIN B12: 255
VITAMIN B12: 260
VITAMIN B12: 366
VITAMIN B12: 260

## 2018-11-30 NOTE — Telephone Encounter (Signed)
PA denied, PPW placed in Dr. Lianne Bushyuncan's In Box.

## 2018-12-01 NOTE — Telephone Encounter (Signed)
I'll work on the hard copy when possible.  Thanks.  

## 2018-12-12 NOTE — Telephone Encounter (Signed)
Message left on VM telling Mr. Eric Nixon that [employer] has denied prior authorization on his Tramadol and Dr. Para Marchuncan does not think an appeal will benefit him.  Mr. Eric Nixon had stated that his back was better for right now anyway and that he is negotiating for a different insurance for next year.

## 2019-10-18 ENCOUNTER — Other Ambulatory Visit: Payer: Self-pay

## 2019-10-18 DIAGNOSIS — Z20822 Contact with and (suspected) exposure to covid-19: Secondary | ICD-10-CM

## 2019-10-20 LAB — NOVEL CORONAVIRUS, NAA: SARS-CoV-2, NAA: NOT DETECTED

## 2020-01-29 ENCOUNTER — Ambulatory Visit (INDEPENDENT_AMBULATORY_CARE_PROVIDER_SITE_OTHER): Payer: Self-pay | Admitting: Family Medicine

## 2020-01-29 ENCOUNTER — Encounter: Payer: Self-pay | Admitting: Family Medicine

## 2020-01-29 ENCOUNTER — Other Ambulatory Visit: Payer: Self-pay

## 2020-01-29 ENCOUNTER — Ambulatory Visit (INDEPENDENT_AMBULATORY_CARE_PROVIDER_SITE_OTHER)
Admission: RE | Admit: 2020-01-29 | Discharge: 2020-01-29 | Disposition: A | Discharge status: Home / Self Care | Payer: Self-pay | Source: Ambulatory Visit | Attending: Family Medicine | Admitting: Family Medicine

## 2020-01-29 VITALS — BP 150/90 | HR 105 | Temp 96.9°F | Ht 66.0 in | Wt 273.3 lb

## 2020-01-29 DIAGNOSIS — M2550 Pain in unspecified joint: Secondary | ICD-10-CM

## 2020-01-29 DIAGNOSIS — M48061 Spinal stenosis, lumbar region without neurogenic claudication: Secondary | ICD-10-CM

## 2020-01-29 DIAGNOSIS — I10 Essential (primary) hypertension: Secondary | ICD-10-CM

## 2020-01-29 DIAGNOSIS — M25569 Pain in unspecified knee: Secondary | ICD-10-CM

## 2020-01-29 DIAGNOSIS — M79643 Pain in unspecified hand: Secondary | ICD-10-CM

## 2020-01-29 LAB — LIPID PANEL
Cholesterol: 201 mg/dL — ABNORMAL HIGH (ref 0–200)
HDL: 75.7 mg/dL (ref >39.00)
LDL Cholesterol: 110 mg/dL — ABNORMAL HIGH (ref 0–99)
NonHDL: 125.63
Total CHOL/HDL Ratio: 3
Triglycerides: 76 mg/dL (ref 0.0–149.0)
VLDL: 15.2 mg/dL (ref 0.0–40.0)

## 2020-01-29 LAB — COMPREHENSIVE METABOLIC PANEL
ALT: 47 U/L (ref 0–53)
AST: 37 U/L (ref 0–37)
Albumin: 4.7 g/dL (ref 3.5–5.2)
Alkaline Phosphatase: 100 U/L (ref 39–117)
BUN: 16 mg/dL (ref 6–23)
CO2: 27 mEq/L (ref 19–32)
Calcium: 10.2 mg/dL (ref 8.4–10.5)
Chloride: 97 mEq/L (ref 96–112)
Creatinine, Ser: 1.2 mg/dL (ref 0.40–1.50)
GFR: 60.87 mL/min (ref >60.00)
Glucose, Bld: 123 mg/dL — ABNORMAL HIGH (ref 70–99)
Potassium: 4.3 mEq/L (ref 3.5–5.1)
Sodium: 136 mEq/L (ref 135–145)
Total Bilirubin: 0.9 mg/dL (ref 0.2–1.2)
Total Protein: 8 g/dL (ref 6.0–8.3)

## 2020-01-29 IMAGING — DX DG KNEE COMPLETE 4+V*R*
4 series · 4 of 4 positions shown · non-contrast
Comparison: None.

CLINICAL DATA: Knee pain

EXAM:
RIGHT KNEE - COMPLETE 4+ VIEW

[knee ap]
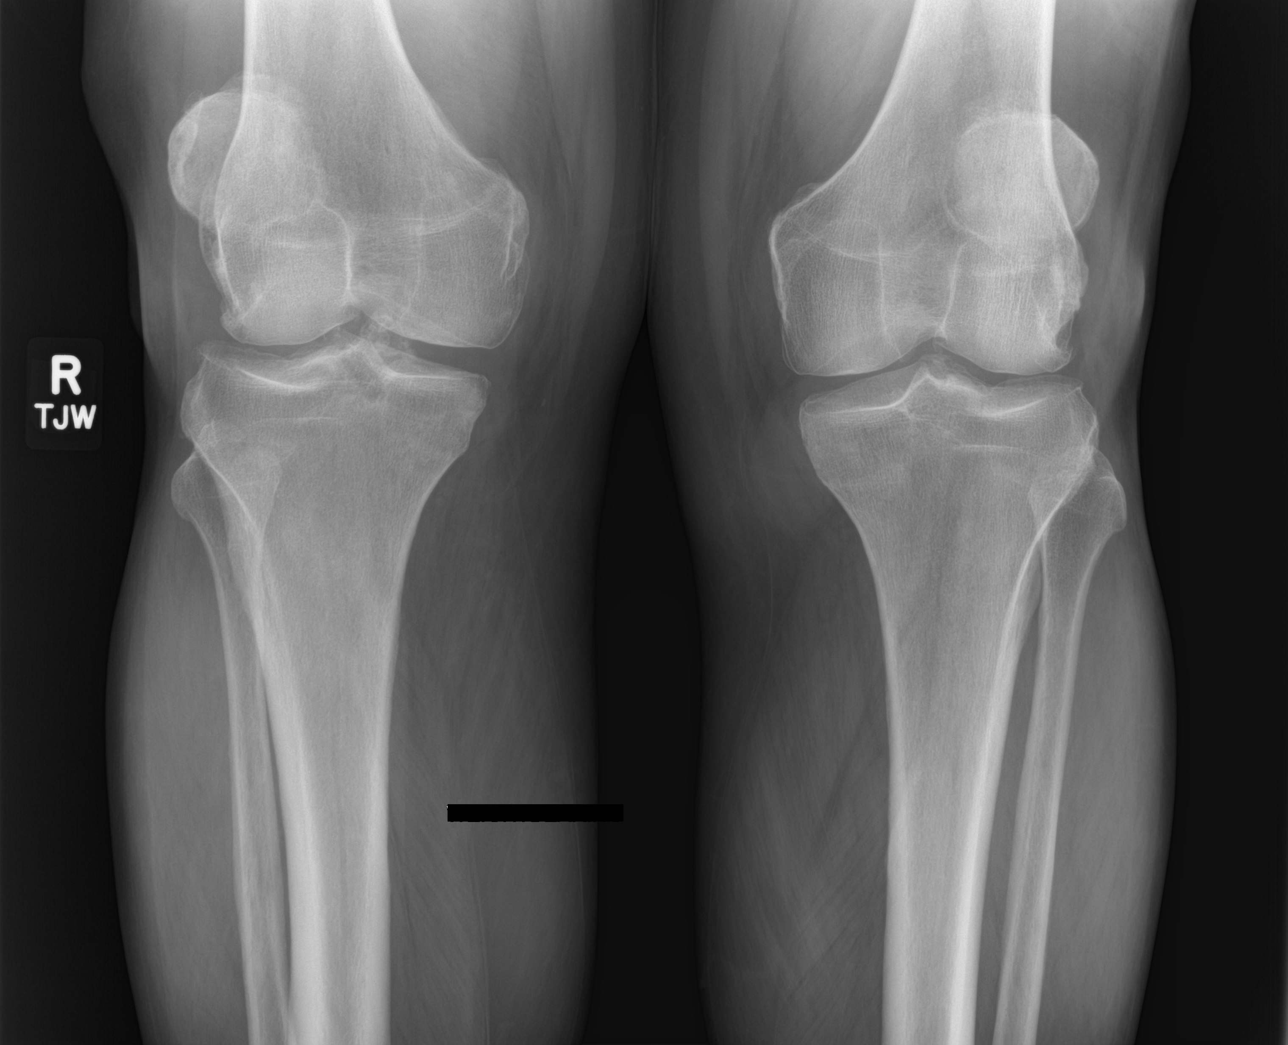

[knee tunnel]
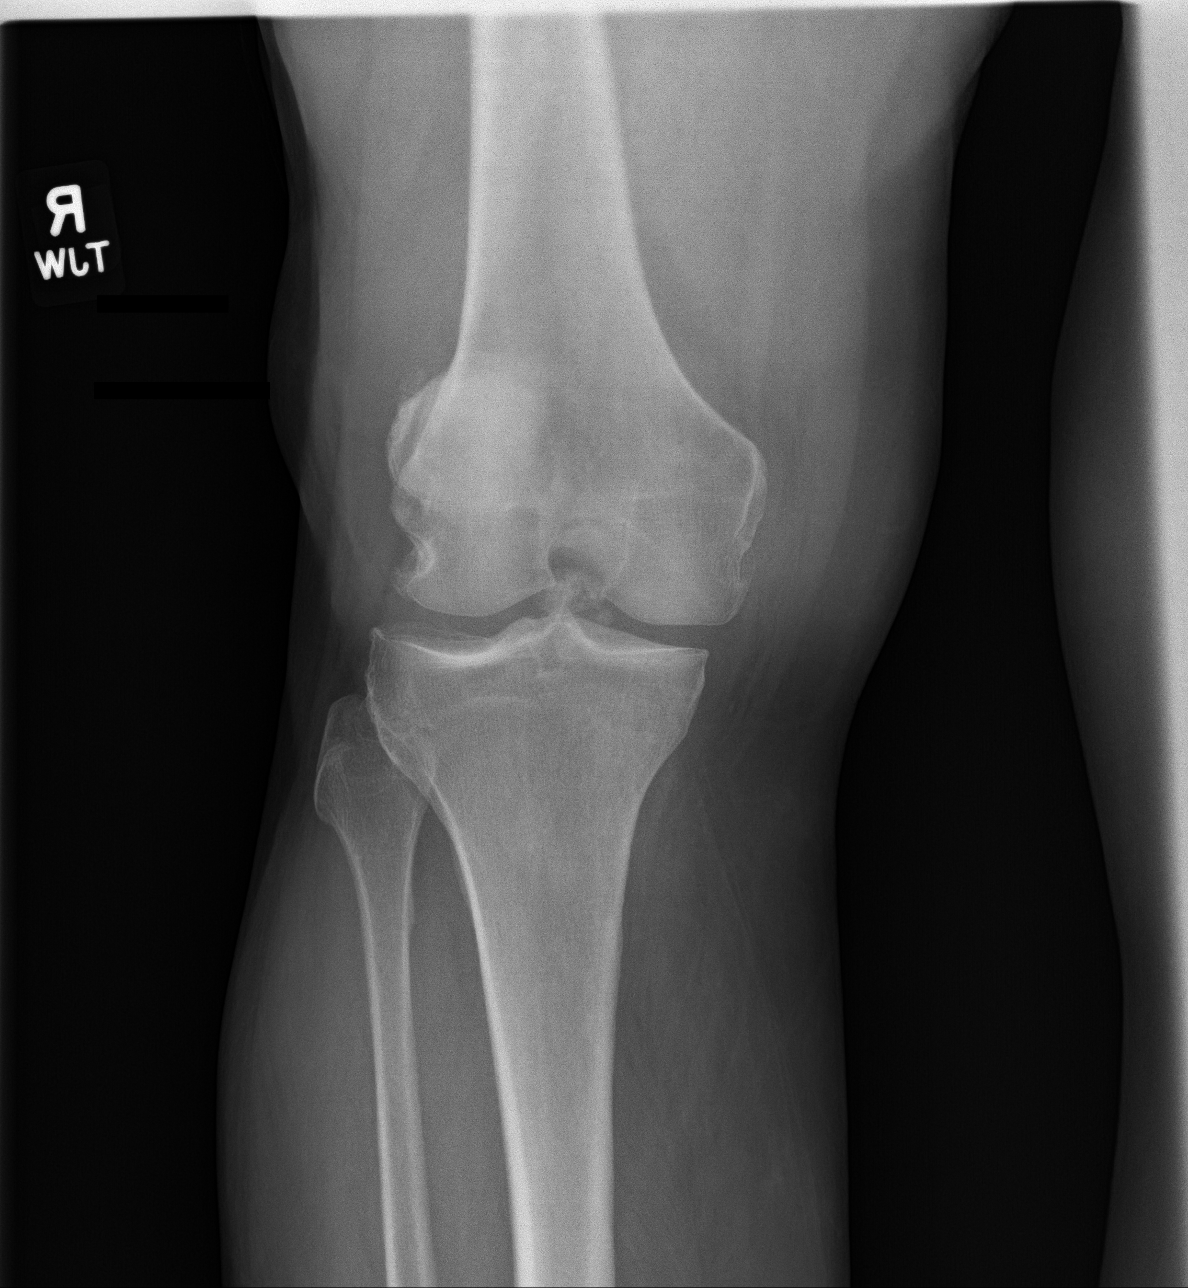

[knee lat]
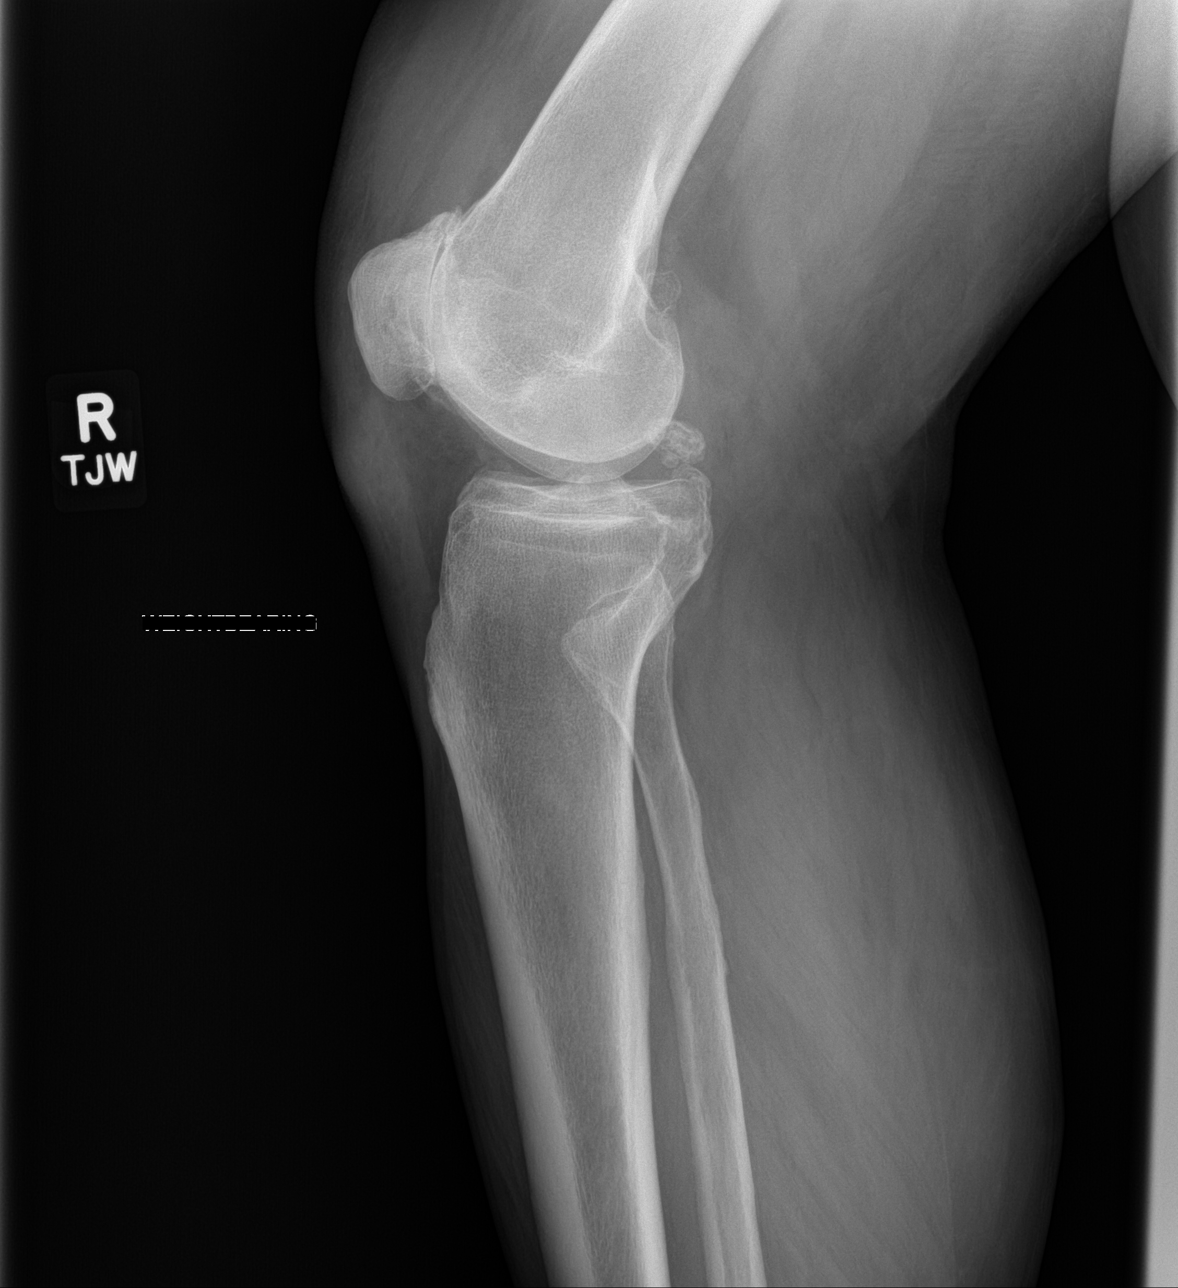

[patella skyline]
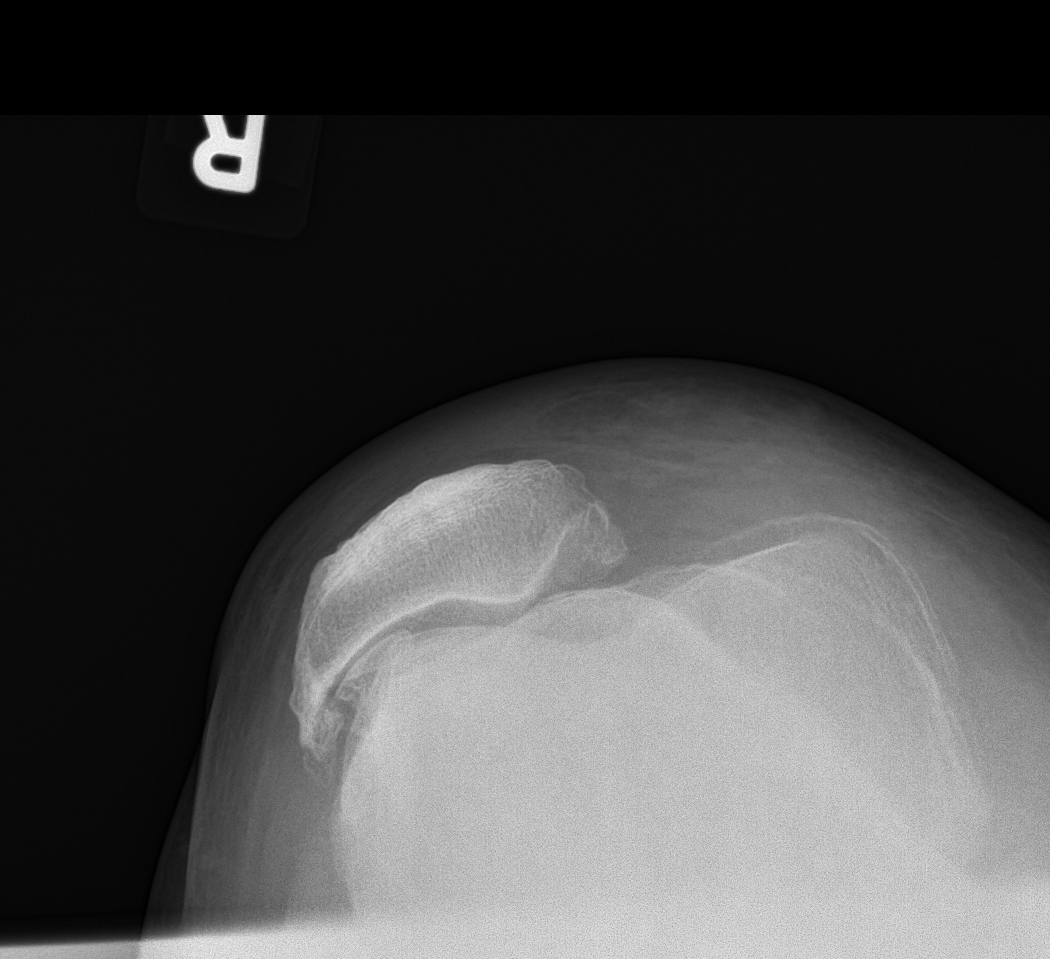

[4 of 4 positions shown; findings below may reference images not displayed]

FINDINGS: There are degenerative changes in the patellofemoral compartment
with joint space narrowing and spurring. Probable patellofemoral
instability. Small joint effusion is present.
IMPRESSION: Probable patellofemoral instability with osteoarthritis. Small joint
effusion.

## 2020-01-29 IMAGING — DX DG HAND COMPLETE 3+V*R*
3 series · 3 of 3 positions shown · non-contrast
Comparison: None.

CLINICAL DATA: Right hand pain, no injuryhand pain

EXAM:
RIGHT HAND - COMPLETE 3+ VIEW

[hand ap]
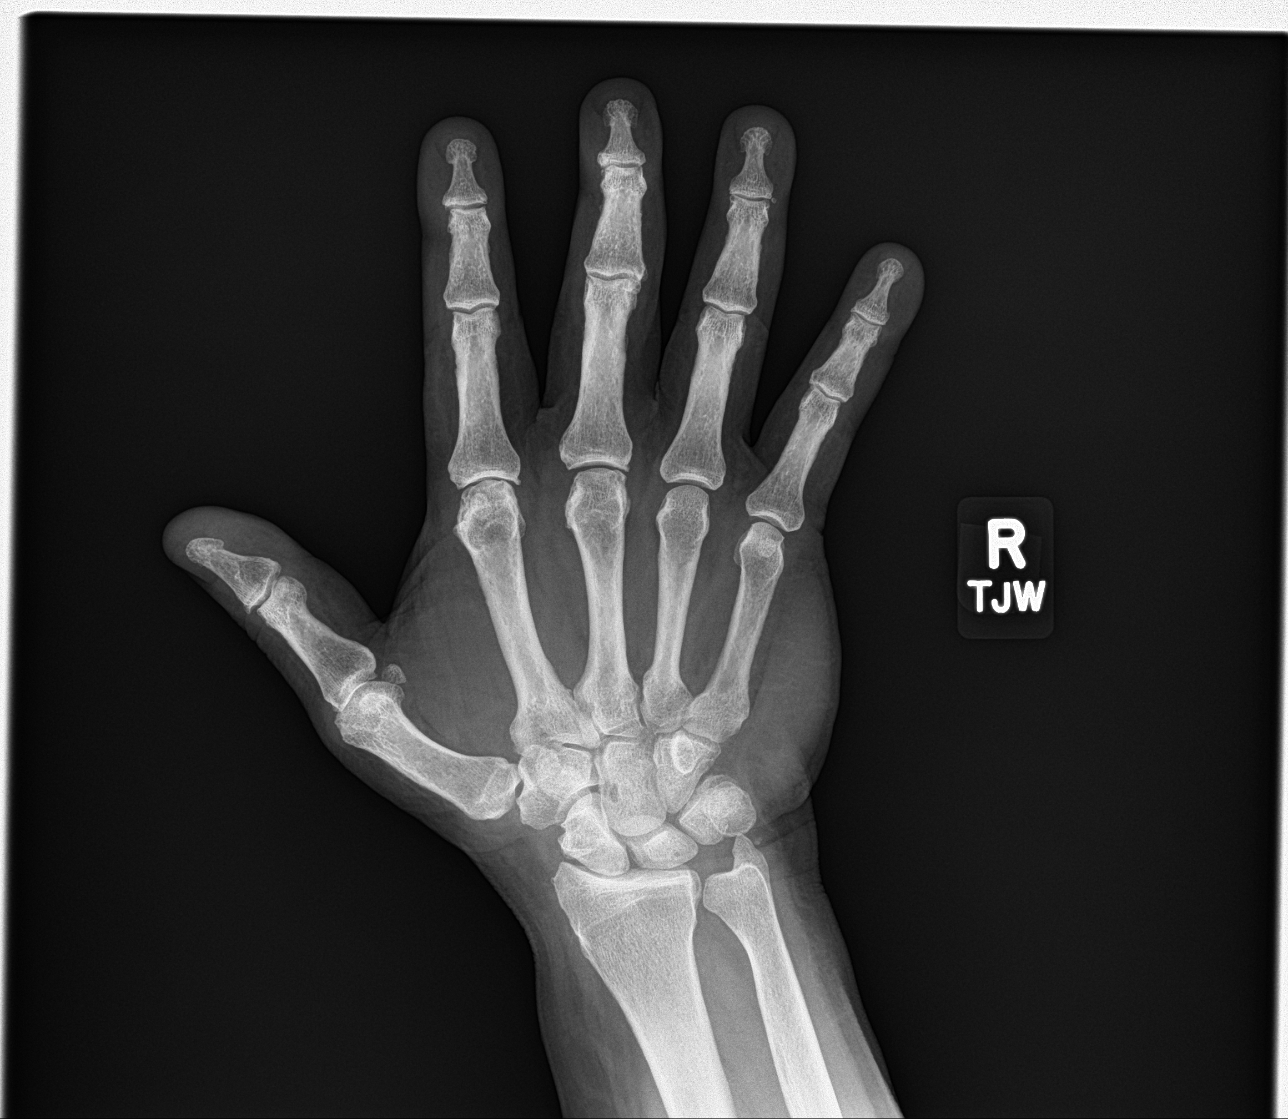

[hand obl]
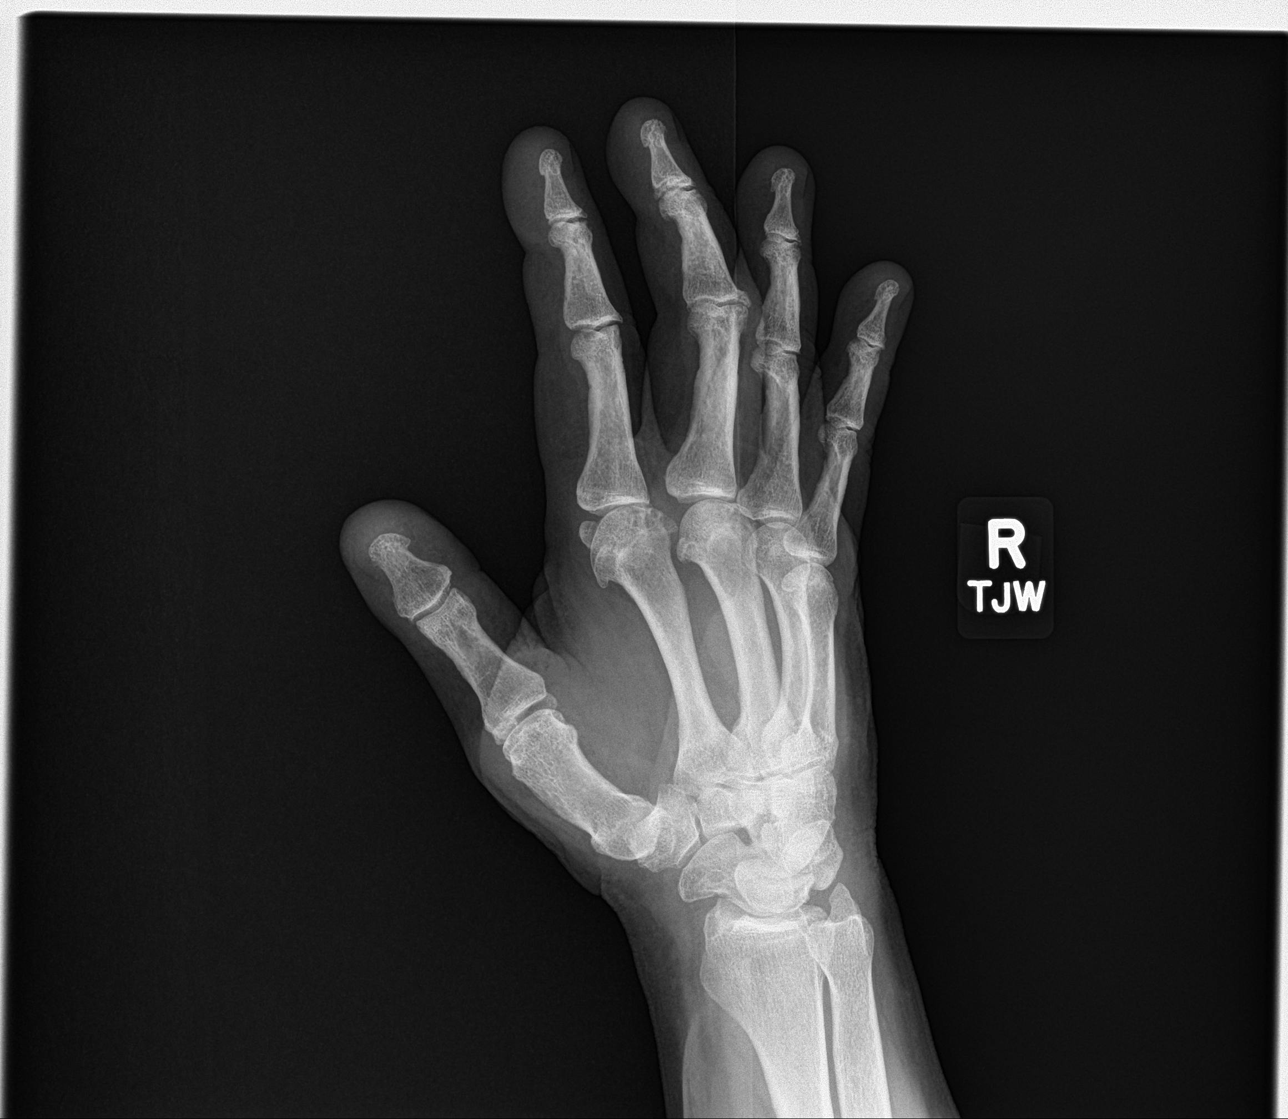

[hand lat]
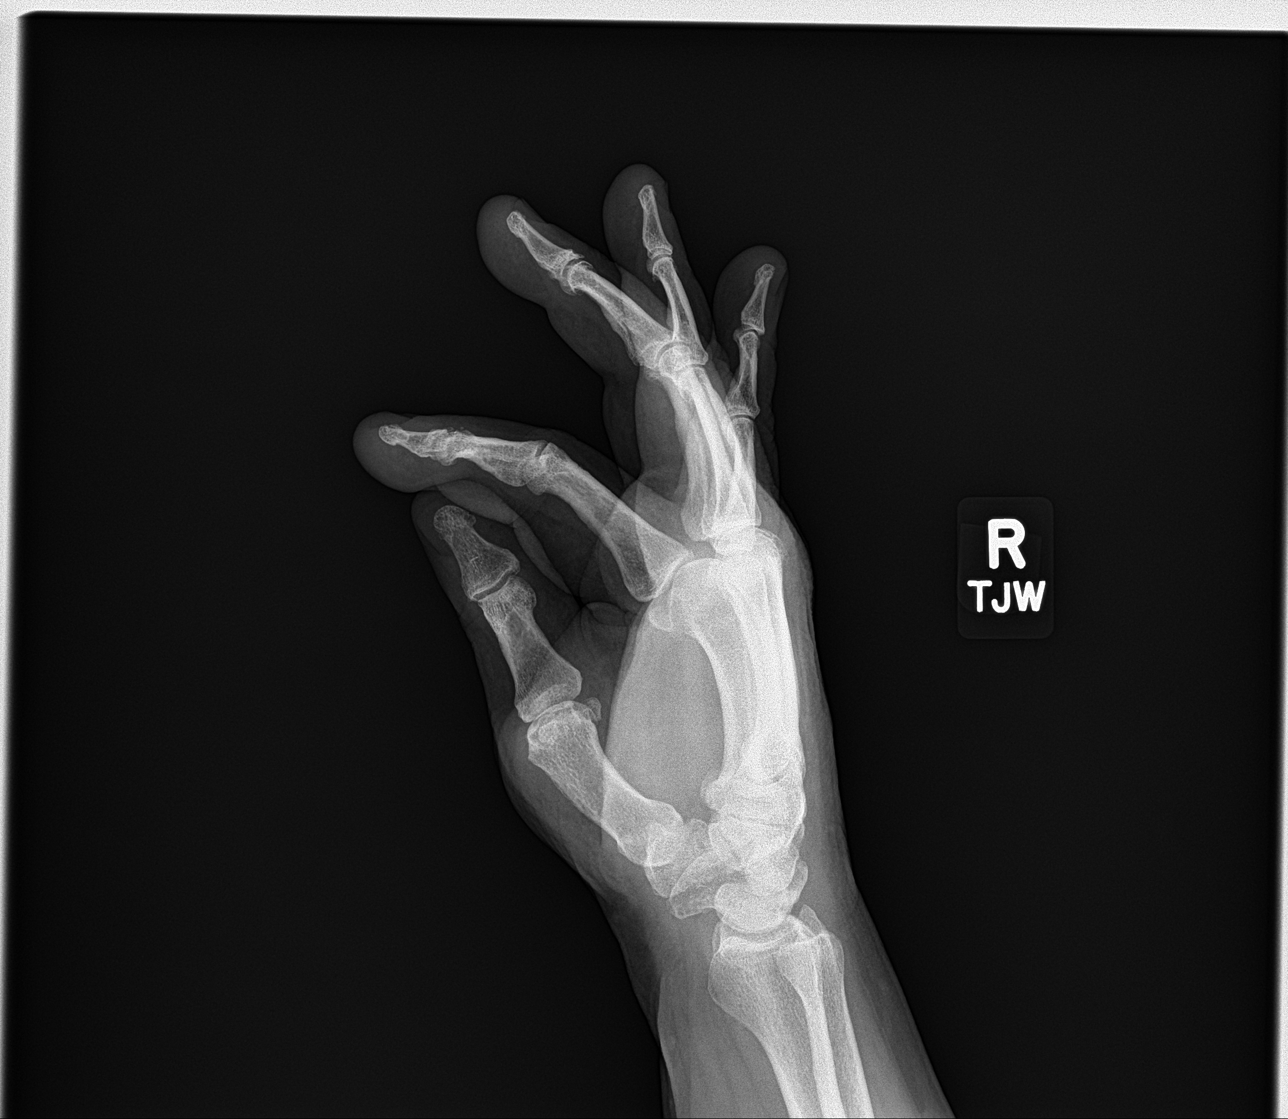

[3 of 3 positions shown; findings below may reference images not displayed]

FINDINGS: No evidence of fracture of the carpal or metacarpal bones.
Radiocarpal joint is intact. Phalanges are normal. No soft tissue
injury.
IMPRESSION: No acute osseous abnormality.

## 2020-01-29 MED ORDER — TRAMADOL HCL 50 MG PO TABS: 50.0000 mg | ORAL_TABLET | Freq: Two times a day (BID) | ORAL | Status: DC | PRN

## 2020-01-29 MED ORDER — LISINOPRIL-HYDROCHLOROTHIAZIDE 20-12.5 MG PO TABS: 1.0000 | ORAL_TABLET | Freq: Every day | ORAL | 3 refills | Status: DC

## 2020-01-29 NOTE — Patient Instructions (Signed)
Go to the lab on the way out.   If you have mychart we'll likely use that to update you.    Check your BP at home and update me if persistently >140/>90. I would avoid nsaids for now.   Try tramadol if needed, use the rx you have.   Take care.  Glad to see you.

## 2020-01-29 NOTE — Progress Notes (Signed)
This visit occurred during the SARS-CoV-2 public health emergency.  Safety protocols were in place, including screening questions prior to the visit, additional usage of staff PPE, and extensive cleaning of exam room while observing appropriate contact time as indicated for disinfecting solutions.  Hypertension:    Using medication without problems or lightheadedness: yes Chest pain with exertion:no Edema: no Short of breath:no He has missed some BP med doses recently.  His Cr improved off nsaids prev.    He is having diffuse aches, is not on statin.  Pain in the back, wrist, knees.  Less back pain recently.  Hand/wrist pain picking up a gallon of milk.  He has the most pain in the hands.    Prev MRI with: 1. At L3-4, L4-5: disc bulging and facet hypertrophy / arthropathy with severe spinal stenosis and severe biforaminal stenosis  2. At L2-3: disc bulging and facet hypertrophy with moderate spinal stenosis and mild biforaminal stenosis  3. At L1-2: disc bulging and facet hypertrophy with mild spinal stenosis and mild biforaminal stenosis  4. At L3 and L4 there is anterior vertebral body edema. Posterior interspinous ligament edema at L3 and L4.  He wanted to defer colonoscopy at this point, discussed with patient.  Meds, vitals, and allergies reviewed.   PMH and SH reviewed  ROS: Per HPI unless specifically indicated in ROS section   GEN: nad, alert and oriented HEENT: ncat NECK: supple w/o LA CV: rrr. PULM: ctab, no inc wob ABD: soft, +bs EXT: no edema SKIN: no acute rash but he has some diffuse pinkish changes on the bilateral second through fifth MCPs.  Does not have any acute or spreading erythema.  He has left thumb pain with range of motion, especially with extreme of flexion, with pain at the left MCP. Bilateral knee crepitus noted. Able to bear weight.

## 2020-01-30 ENCOUNTER — Other Ambulatory Visit: Payer: Self-pay | Admitting: Family Medicine

## 2020-01-30 DIAGNOSIS — M2550 Pain in unspecified joint: Secondary | ICD-10-CM | POA: Insufficient documentation

## 2020-01-30 DIAGNOSIS — R739 Hyperglycemia, unspecified: Secondary | ICD-10-CM

## 2020-01-30 NOTE — Assessment & Plan Note (Signed)
Discussed options.  Would not restart NSAIDs at this point, not at least until we can see his creatinine.  He can use tramadol as discussed regarding his back pain.  Check plain films today.  See notes on imaging.

## 2020-01-30 NOTE — Assessment & Plan Note (Signed)
He has known spinal stenosis.  Discussed using tramadol as needed.  He has an old prescription for that that he can use.  He did not use it previously.  Routine cautions given.

## 2020-01-30 NOTE — Assessment & Plan Note (Addendum)
See notes on labs.  No change in meds at this point.  He can update me if his blood pressure is persistently elevated at home.  See after visit summary. At least 30 minutes were devoted to patient care in this encounter (this can potentially include time spent reviewing the patient's file/history, interviewing and examining the patient, counseling/reviewing plan with patient, ordering referrals, ordering tests, reviewing relevant laboratory or x-ray data, and documenting the encounter).

## 2021-03-22 ENCOUNTER — Other Ambulatory Visit: Payer: Self-pay | Admitting: Family Medicine

## 2021-03-23 NOTE — Telephone Encounter (Signed)
LVMTCB

## 2021-03-23 NOTE — Telephone Encounter (Signed)
Please call patient and schedule physical then send back to CMA for refill.

## 2021-05-13 MED ORDER — LISINOPRIL-HYDROCHLOROTHIAZIDE 20-12.5 MG PO TABS: 1.0000 | ORAL_TABLET | Freq: Every day | ORAL | 0 refills | Status: DC

## 2021-05-13 NOTE — Telephone Encounter (Signed)
Called patient he is coming in on Friday

## 2021-05-13 NOTE — Addendum Note (Signed)
Addended by: Wendie Simmer B on: 05/13/2021 01:34 PM   Modules accepted: Orders

## 2021-05-18 ENCOUNTER — Ambulatory Visit: Payer: Self-pay | Admitting: Family Medicine

## 2021-06-15 ENCOUNTER — Other Ambulatory Visit: Payer: Self-pay

## 2021-06-15 ENCOUNTER — Encounter: Payer: Self-pay | Admitting: Family Medicine

## 2021-06-15 ENCOUNTER — Ambulatory Visit (INDEPENDENT_AMBULATORY_CARE_PROVIDER_SITE_OTHER): Payer: Self-pay | Admitting: Family Medicine

## 2021-06-15 VITALS — BP 130/82 | HR 82 | Temp 98.0°F | Ht 66.0 in | Wt 251.0 lb

## 2021-06-15 DIAGNOSIS — M2550 Pain in unspecified joint: Secondary | ICD-10-CM

## 2021-06-15 DIAGNOSIS — I10 Essential (primary) hypertension: Secondary | ICD-10-CM

## 2021-06-15 LAB — COMPREHENSIVE METABOLIC PANEL
ALT: 27 U/L (ref 0–53)
AST: 20 U/L (ref 0–37)
Albumin: 4.6 g/dL (ref 3.5–5.2)
Alkaline Phosphatase: 84 U/L (ref 39–117)
BUN: 20 mg/dL (ref 6–23)
CO2: 25 mEq/L (ref 19–32)
Calcium: 9.7 mg/dL (ref 8.4–10.5)
Chloride: 101 mEq/L (ref 96–112)
Creatinine, Ser: 1.04 mg/dL (ref 0.40–1.50)
GFR: 75.18 mL/min (ref >60.00)
Glucose, Bld: 99 mg/dL (ref 70–99)
Potassium: 4.3 mEq/L (ref 3.5–5.1)
Sodium: 135 mEq/L (ref 135–145)
Total Bilirubin: 0.5 mg/dL (ref 0.2–1.2)
Total Protein: 7.5 g/dL (ref 6.0–8.3)

## 2021-06-15 LAB — LIPID PANEL
Cholesterol: 177 mg/dL (ref 0–200)
HDL: 38.1 mg/dL — ABNORMAL LOW (ref >39.00)
LDL Cholesterol: 112 mg/dL — ABNORMAL HIGH (ref 0–99)
NonHDL: 138.56
Total CHOL/HDL Ratio: 5
Triglycerides: 132 mg/dL (ref 0.0–149.0)
VLDL: 26.4 mg/dL (ref 0.0–40.0)

## 2021-06-15 LAB — SEDIMENTATION RATE: Sed Rate: 18 mm/hr (ref 0–20)

## 2021-06-15 LAB — URIC ACID: Uric Acid, Serum: 6.2 mg/dL (ref 4.0–7.8)

## 2021-06-15 MED ORDER — IBUPROFEN 200 MG PO TABS: 200.0000 mg | ORAL_TABLET | Freq: Four times a day (QID) | ORAL | Status: DC | PRN

## 2021-06-15 NOTE — Patient Instructions (Addendum)
Go to the lab on the way out.   If you have mychart we'll likely use that to update you.    Take care.  Glad to see you. Let me check on options in the meantime about rheumatology.

## 2021-06-15 NOTE — Progress Notes (Signed)
This visit occurred during the SARS-CoV-2 public health emergency.  Safety protocols were in place, including screening questions prior to the visit, additional usage of staff PPE, and extensive cleaning of exam room while observing appropriate contact time as indicated for disinfecting solutions.  Walking with cane at baseline now.  He is off diclofenac.  More joint pain in the knees, neck, hips and hands in the last 6 months.  Started taking ibuprofen in the meantime, 6-8 tabs a day.  No recent labs.  Ibuprofen helped some.  Puffy at the L distal radius, pain lifting small amounts of weight, ie a gallon of milk.  R 3rd PIP puffy but not red- that joint locks on flexion.  He can have joint swelling in the hands but not ankle edema.  He noted dec muscle mass diffusely.    Hypertension:    Using medication without problems or lightheadedness: yes Chest pain with exertion:no Edema:no BLE edema.   Short of breath:no  Meds, vitals, and allergies reviewed.  PMH and SH reviewed  ROS: Per HPI unless specifically indicated in ROS section   GEN: nad, alert and oriented HEENT: ncat NECK: supple w/o LA CV: rrr. PULM: ctab, no inc wob ABD: soft, +bs EXT: no edema SKIN: no acute rash Neck pain with rotation to the left.   Pain with B wrist flexion and ext and radial/ulnar deviation.

## 2021-06-18 ENCOUNTER — Other Ambulatory Visit: Payer: Self-pay | Admitting: Family Medicine

## 2021-06-18 DIAGNOSIS — M2550 Pain in unspecified joint: Secondary | ICD-10-CM

## 2021-06-18 NOTE — Assessment & Plan Note (Signed)
Continue lisinopril HCTZ with recheck labs pending.  nsaid cautions d/w pt.

## 2021-06-18 NOTE — Assessment & Plan Note (Signed)
He will likely need to see rheumatology again.  Limit NSAIDs in the meantime.  He never took tramadol.  He did not have an adverse effect on it, he just did not use it.  Discussed options but we need to see his labs first.  Still okay for outpatient follow-up. 35 minutes were devoted to patient care in this encounter (this includes time spent reviewing the patient's file/history, interviewing and examining the patient, counseling/reviewing plan with patient).

## 2021-07-14 ENCOUNTER — Telehealth: Payer: Self-pay | Admitting: Family Medicine

## 2021-07-14 NOTE — Telephone Encounter (Signed)
I saw referral was denied by Rheum. Is this being sent somewhere else?

## 2021-07-14 NOTE — Telephone Encounter (Signed)
Mr. Eric Nixon called in wanted to know about his referral for rheumatology. And he was here a couple of weeks ago.

## 2021-07-21 NOTE — Telephone Encounter (Signed)
Spoke with patient and he is okay with going to [facility]  All records faxed from most recent OV and also tests from 2018 per patient request  Referral and OV notes faxed via [facility]. Faxed to [phone]    [email or web address]  Maisie Fus, CMA on 07/21/2021 1642 - delivered at 07/21/2021 1642   Patient has number to office and will call if not heard anything within the next 1-2 weeks.   Nothing further needed.

## 2021-07-21 NOTE — Telephone Encounter (Signed)
Referral was denied by Dr Fatima Sanger office - will have to be referred elsewhere  Patient is okay with [facility]

## 2021-08-21 DIAGNOSIS — M4722 Other spondylosis with radiculopathy, cervical region: Secondary | ICD-10-CM | POA: Insufficient documentation

## 2021-08-25 ENCOUNTER — Other Ambulatory Visit: Payer: Self-pay | Admitting: Family Medicine

## 2021-08-25 DIAGNOSIS — M5416 Radiculopathy, lumbar region: Secondary | ICD-10-CM

## 2021-08-25 DIAGNOSIS — M5412 Radiculopathy, cervical region: Secondary | ICD-10-CM

## 2021-09-02 ENCOUNTER — Ambulatory Visit
Admission: RE | Admit: 2021-09-02 | Discharge: 2021-09-02 | Disposition: A | Discharge status: Home / Self Care | Payer: Medicare Other | Source: Ambulatory Visit | Attending: Family Medicine | Admitting: Family Medicine

## 2021-09-02 ENCOUNTER — Other Ambulatory Visit: Payer: Self-pay

## 2021-09-02 DIAGNOSIS — M5412 Radiculopathy, cervical region: Secondary | ICD-10-CM | POA: Insufficient documentation

## 2021-09-02 DIAGNOSIS — M5416 Radiculopathy, lumbar region: Secondary | ICD-10-CM | POA: Diagnosis present

## 2021-09-02 IMAGING — MR MR CERVICAL SPINE W/O CM
5 series · 38 of 48 positions shown · non-contrast
Comparison: MRI cervical spine dated [DATE].

CLINICAL DATA: Chronic neck pain.  No prior surgery.

EXAM:
MRI CERVICAL SPINE WITHOUT CONTRAST
TECHNIQUE: Multiplanar, multisequence MR imaging of the cervical spine was
performed. No intravenous contrast was administered.

[Series 5: T2 · sagittal · 3.0mm · 0.62mm/px · 6 of 15 slices shown (1 of 2)]
[im 1/15]
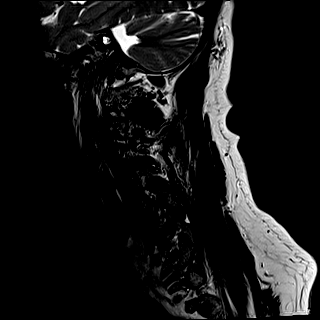
[im 3/15]
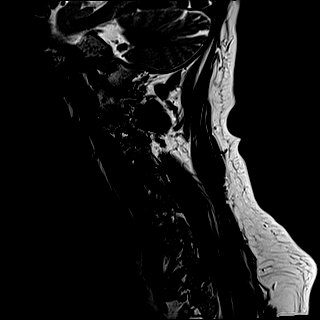
[im 6/15]
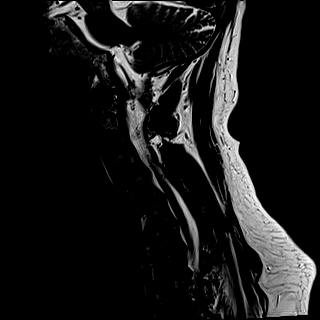
[im 9/15]
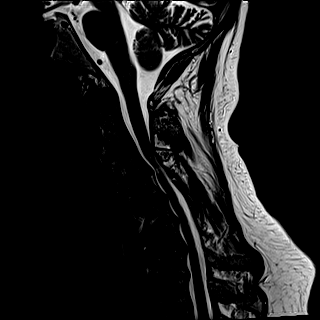
[im 12/15]
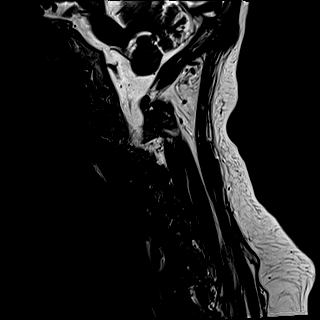
[im 15/15]
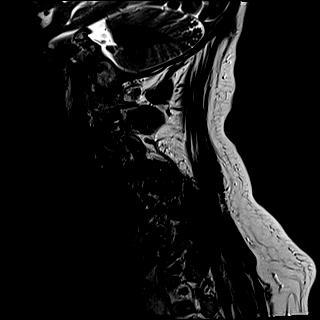

[Series 6: FLAIR · sagittal · 3.0mm · 0.78mm/px · 7 of 15 slices shown]
[im 1/15]
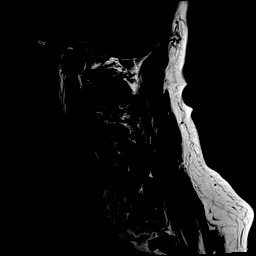
[im 3/15]
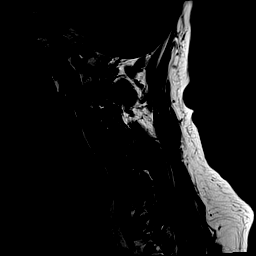
[im 5/15]
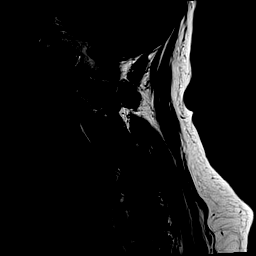
[im 8/15]
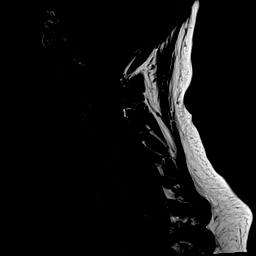
[im 10/15]
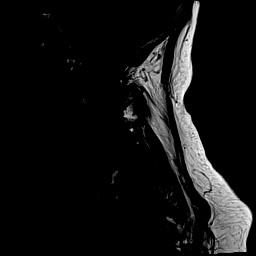
[im 12/15]
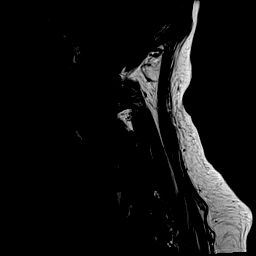
[im 15/15]
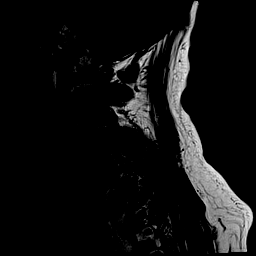

[Series 7: STIR · sagittal · 3.0mm · 0.62mm/px · 7 of 15 slices shown]
[im 1/15]
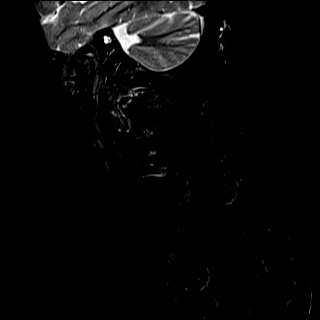
[im 3/15]
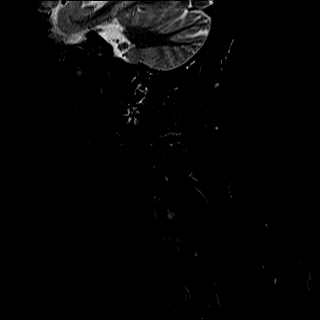
[im 5/15]
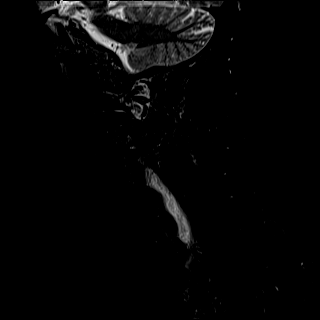
[im 8/15]
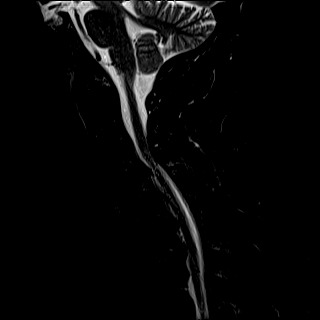
[im 10/15]
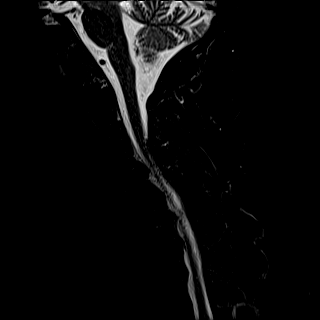
[im 12/15]
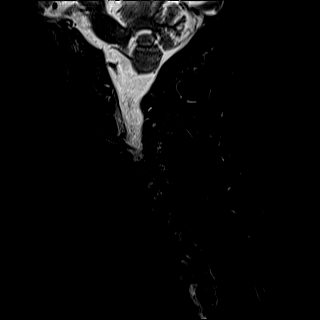
[im 15/15]
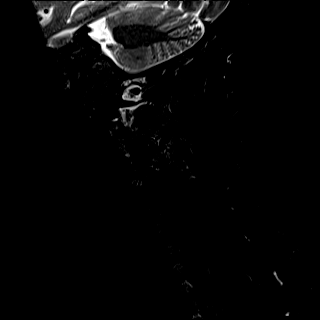

[Series 8: T2 · axial · 3.0mm · 0.70mm/px · z∈[-128,-21]mm · 10 of 32 slices shown (2 of 2)]
[im 1/32]
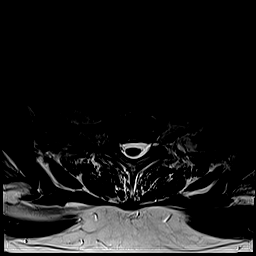
[im 3/32]
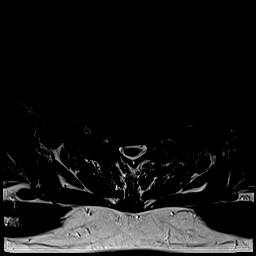
[im 5/32]
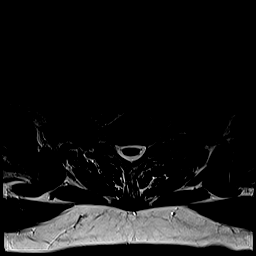
[im 8/32]
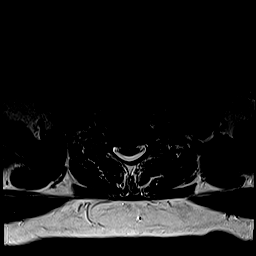
[im 10/32]
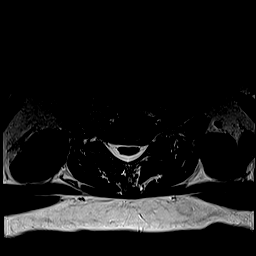
[im 15/32]
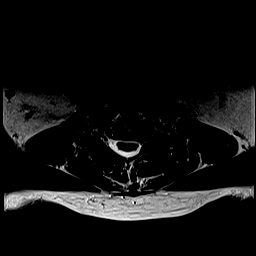
[im 17/32]
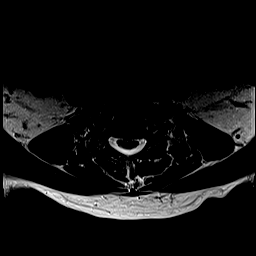
[im 22/32]
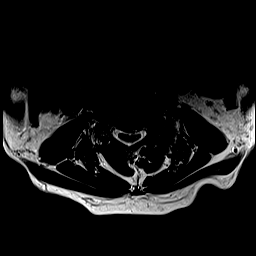
[im 27/32]
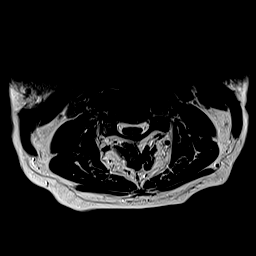
[im 32/32]
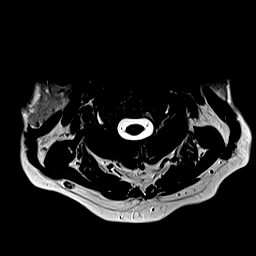

[Series 9: ax mpgr · axial · 3.0mm · 0.35mm/px · z∈[-128,-21]mm · 8 of 32 slices shown]
[im 1/32]
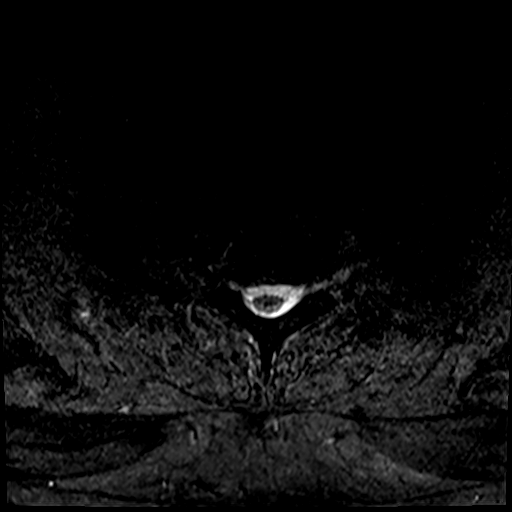
[im 5/32]
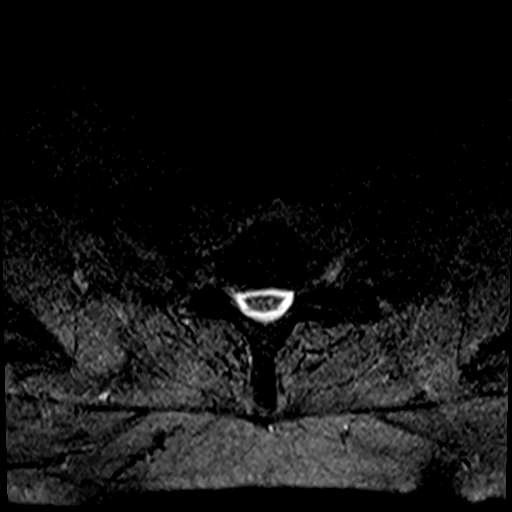
[im 10/32]
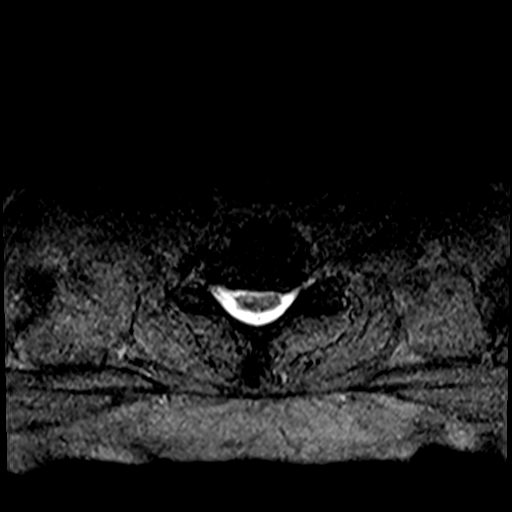
[im 15/32]
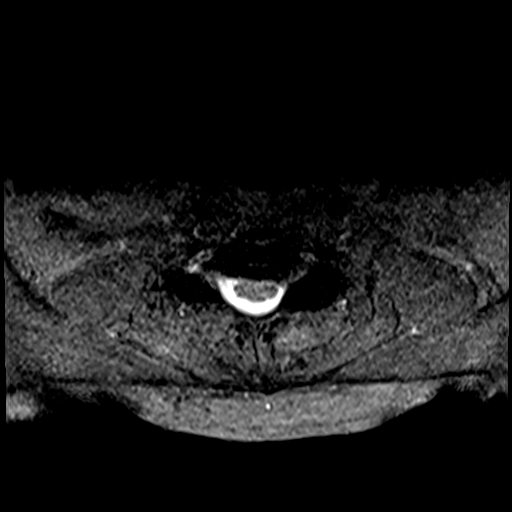
[im 17/32]
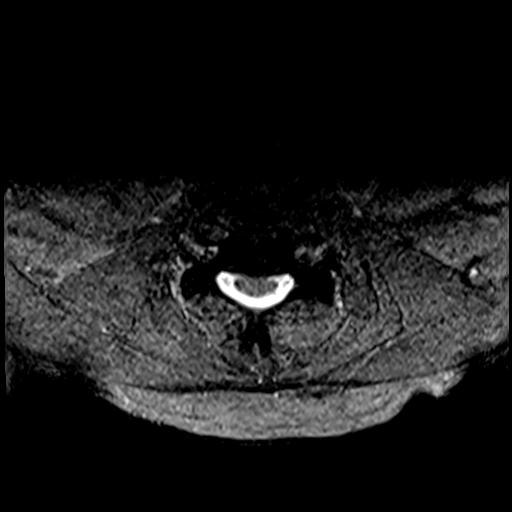
[im 22/32]
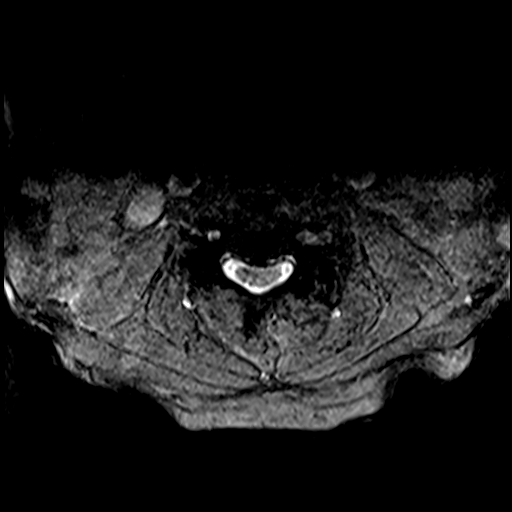
[im 27/32]
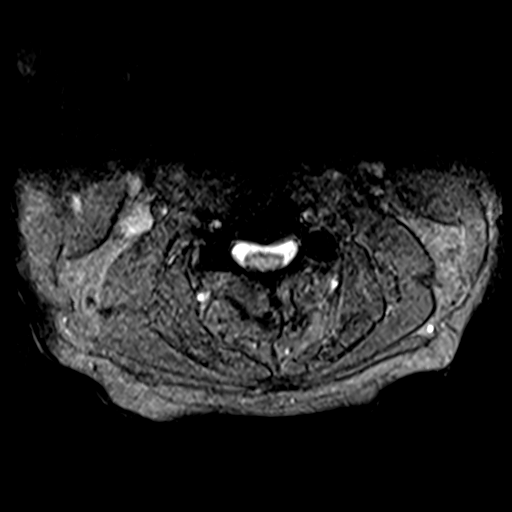
[im 32/32]
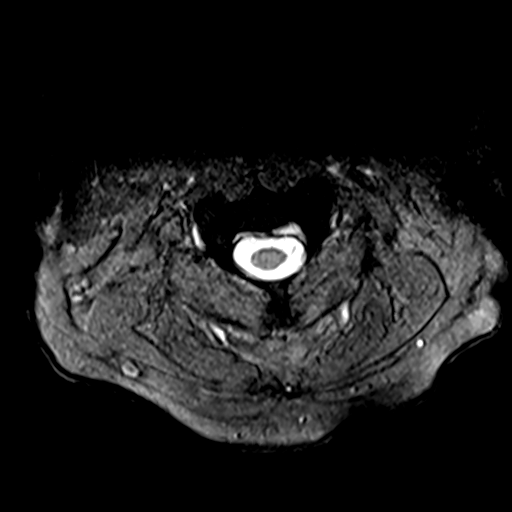

[38 of 48 positions shown; findings below may reference images not displayed]

FINDINGS: Alignment: Unchanged reversal of the normal cervical lordosis. No
significant listhesis.

Vertebrae: No fracture, evidence of discitis, or bone lesion.

Cord: New cord atrophy and myelomalacia on the left at C3-C4.

Posterior Fossa, vertebral arteries, paraspinal tissues: Negative.

Disc levels:

C2-C3: Negative disc. Mild bilateral facet arthropathy. No stenosis.

C3-C4: New left paracentral disc osteophyte complex. Progressive
severe left and moderate right facet arthropathy. New mild spinal
canal stenosis. Worsened severe left neuroforaminal stenosis. New
severe right neuroforaminal stenosis.

C4-C5: Mild disc bulging. Progressive moderate bilateral facet
arthropathy. New mild bilateral neuroforaminal stenosis. No spinal
canal stenosis.

C5-C6: Unchanged small posterior disc osteophyte complex and
mild-to-moderate bilateral facet arthropathy. Unchanged moderate
left neuroforaminal stenosis. No spinal canal right neuroforaminal
stenosis.

C6-C7: Unchanged small posterior disc osteophyte complex and
bilateral uncovertebral hypertrophy. Unchanged moderate left and
mild right neuroforaminal stenosis. No spinal canal stenosis.

C7-T1: Unchanged small posterior disc osteophyte complex and
mild-to-moderate bilateral facet uncovertebral hypertrophy.
Unchanged moderate bilateral neuroforaminal stenosis. No spinal
canal stenosis.
IMPRESSION: 1. Progressive multilevel cervical spondylosis as described above.
New cord atrophy and myelomalacia on the left at C3-C4.
2. Progressive severe left and new severe right neuroforaminal
stenosis at C3-C4.

## 2021-09-02 IMAGING — MR MR LUMBAR SPINE W/O CM
4 of 5 series · 33 of 48 positions shown · non-contrast
Comparison: MRI [DATE]

CLINICAL DATA: Low back pain and hip pain

EXAM:
MRI LUMBAR SPINE WITHOUT CONTRAST
TECHNIQUE: Multiplanar, multisequence MR imaging of the lumbar spine was
performed. No intravenous contrast was administered.

[Series 9: T2 · sagittal · 4.0mm · 0.81mm/px · 8 of 19 slices shown (1 of 2)]
[im 1/19]
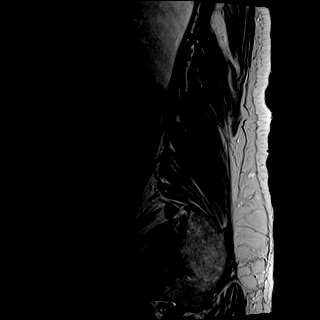
[im 3/19]
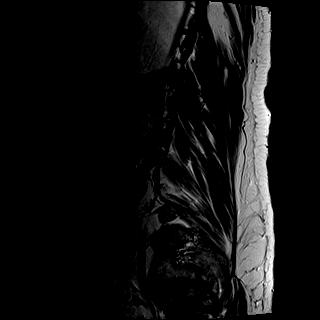
[im 6/19]
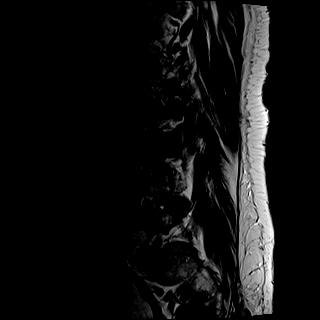
[im 8/19]
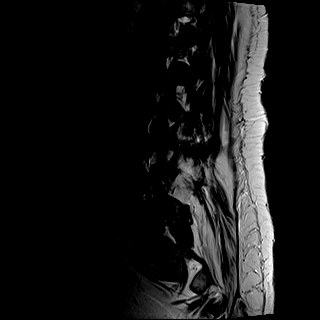
[im 11/19]
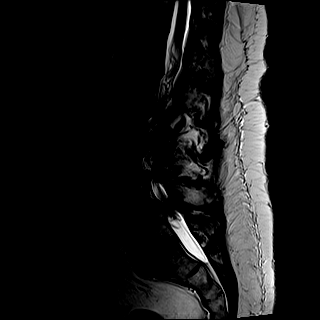
[im 13/19]
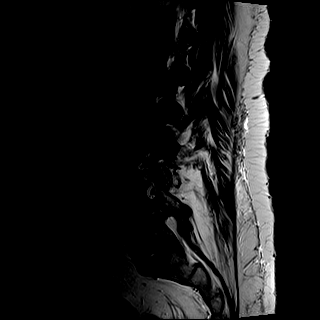
[im 16/19]
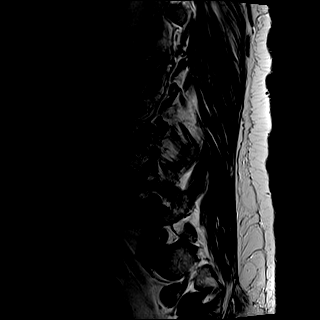
[im 19/19]
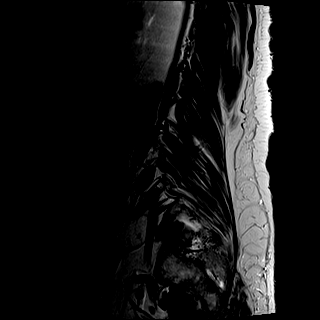

[Series 10: T1 · sagittal · 4.0mm · 0.81mm/px · 7 of 19 slices shown (1 of 2)]
[im 1/19]
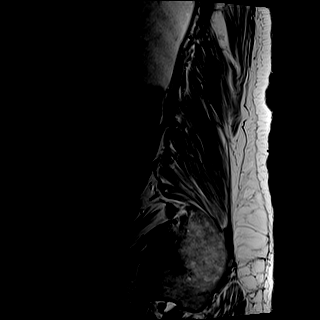
[im 4/19]
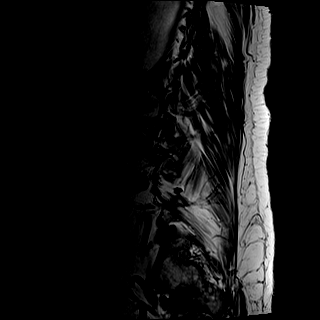
[im 7/19]
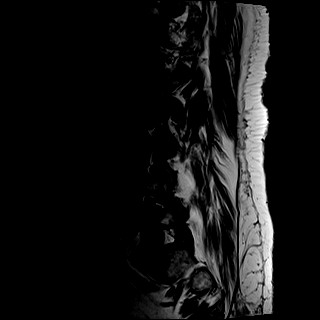
[im 10/19]
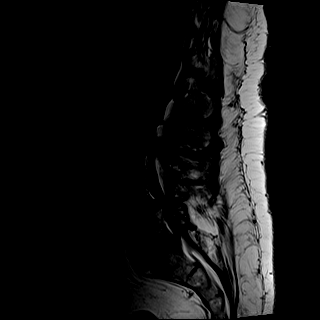
[im 13/19]
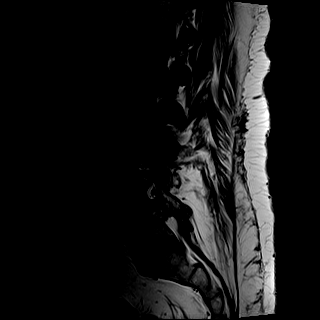
[im 16/19]
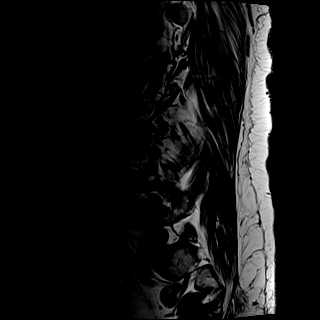
[im 19/19]
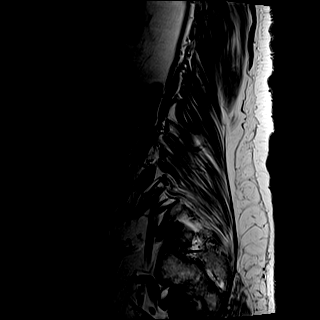

[Series 12: T2 · axial · 4.0mm · 0.78mm/px · z∈[-605,-403]mm · 9 of 33 slices shown (2 of 2)]
[im 1/33]
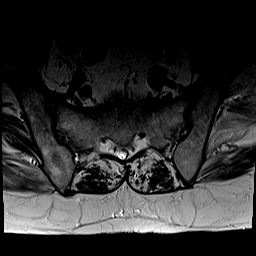
[im 6/33]
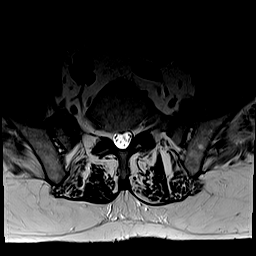
[im 11/33]
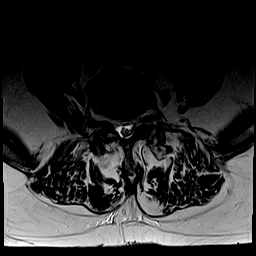
[im 14/33]
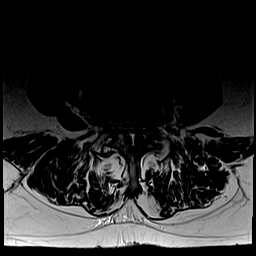
[im 17/33]
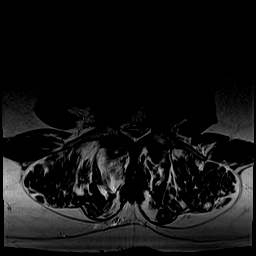
[im 19/33]
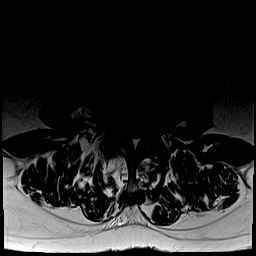
[im 22/33]
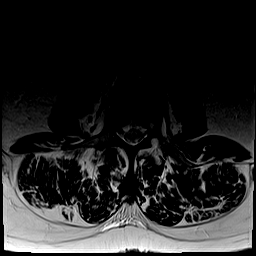
[im 27/33]
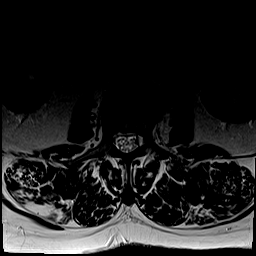
[im 33/33]
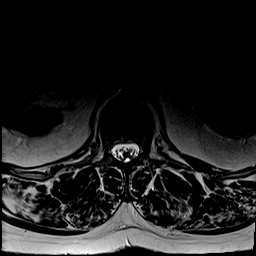

[Series 13: T1 · axial · 4.0mm · 0.39mm/px · z∈[-605,-403]mm · 9 of 33 slices shown (2 of 2)]
[im 1/33]
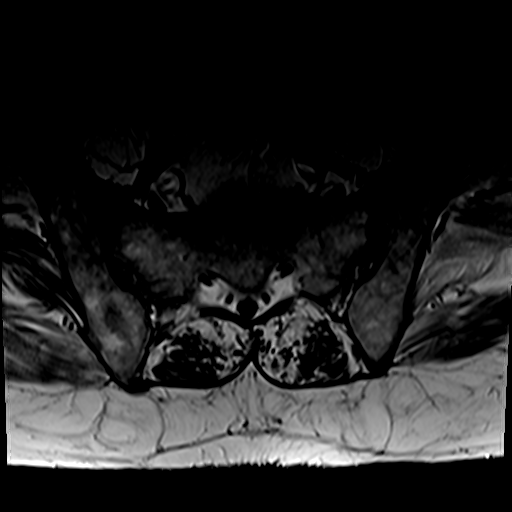
[im 6/33]
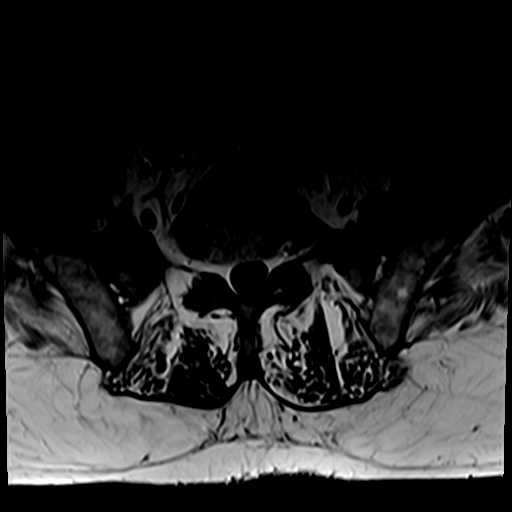
[im 11/33]
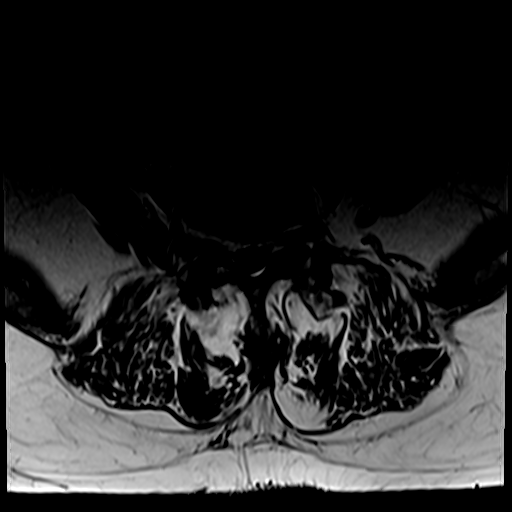
[im 14/33]
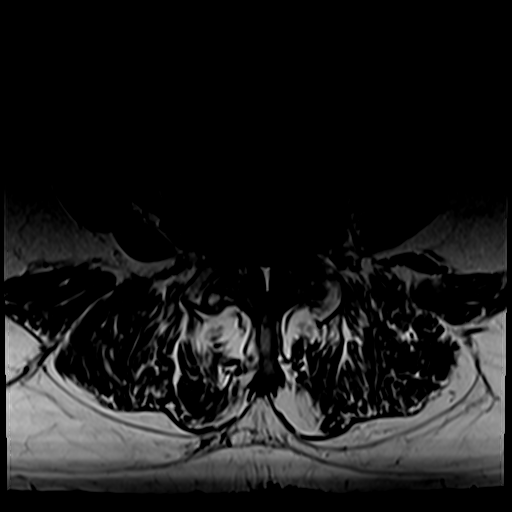
[im 17/33]
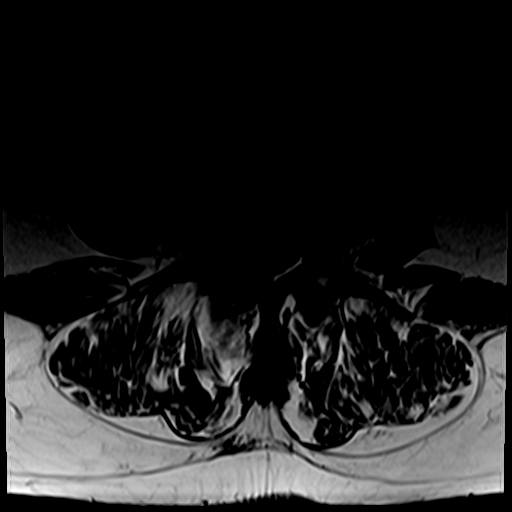
[im 19/33]
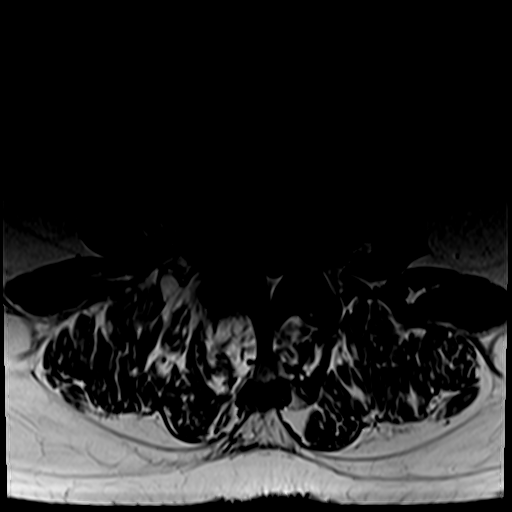
[im 22/33]
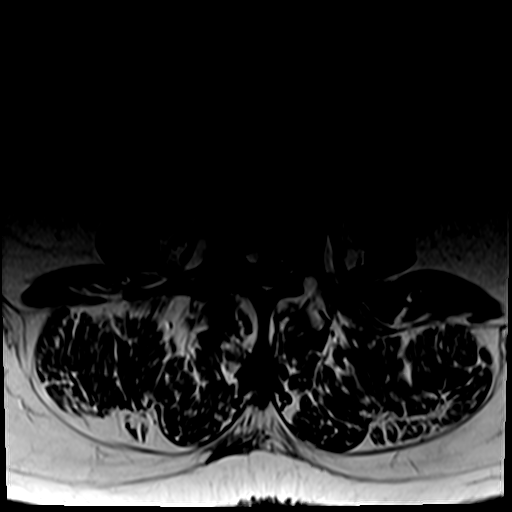
[im 27/33]
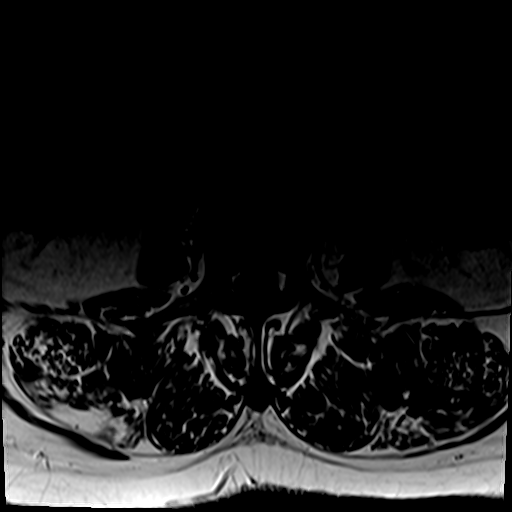
[im 33/33]
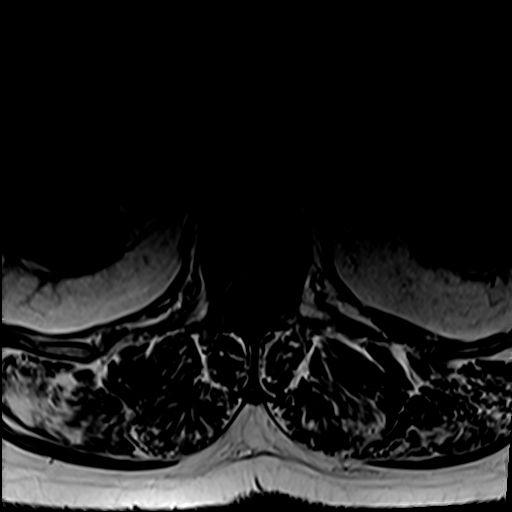

[33 of 48 positions shown; findings below may reference images not displayed]

FINDINGS: Segmentation: Presumed standard anatomy with the inferior-most well
developed disc space designated as L5-S1.

Alignment: Slight lumbar scoliotic curvature. 5 mm grade 1
anterolisthesis L4 on L5, unchanged.

Vertebrae: No fracture, evidence of discitis, or bone lesion.
Progressive discogenic endplate marrow changes at L2-3. There is
also reactive marrow edema adjacent to the right L2-3 facet joint.
Small right facet joint effusion.

Conus medullaris and cauda equina: Conus extends to the T12-L1
level. There is bunching of the cauda equina nerve roots above the
levels of stenosis.

Paraspinal and other soft tissues: Negative.

Disc levels:

T12-L1: Mild annular disc bulge with right foraminal protrusion.
Mild bilateral facet arthropathy. Mild right foraminal stenosis. No
canal stenosis. No significant interval progression from prior.

L1-L2: Diffuse disc osteophyte complex with mild bilateral facet
arthropathy and ligamentum flavum buckling. Findings result in
moderate to severe canal stenosis with mild bilateral foraminal
stenosis, left worse than right. Findings slightly progressed from
prior.

L2-L3: Diffuse disc osteophyte complex with moderate bilateral facet
arthropathy and ligamentum flavum buckling. Findings result in
severe canal stenosis with severe right and mild left foraminal
stenosis. Findings progressed from prior.

L3-L4: Chronic disc height loss with diffuse endplate ridging.
Advanced bilateral facet arthropathy and ligamentum flavum buckling.
Findings result in severe canal stenosis with moderate bilateral
foraminal stenosis, left worse than right. Findings progressed from
prior.

L4-L5: Disc uncovering with diffuse disc bulge. Severe bilateral
facet arthropathy and ligamentum flavum buckling. Findings
contribute to severe canal stenosis with mild-to-moderate left and
mild right foraminal stenosis. Overall, findings are similar to
prior.

L5-S1: Unremarkable disc and facet joints. No foraminal or canal
stenosis.
IMPRESSION: 1. Advanced multilevel lumbar spondylosis, progressed from prior MRI
[DATE].
[DATE]. Severe canal stenosis at the L2-3, L3-4, and L4-5 levels.
Moderate-to-severe canal stenosis at L1-2.
3. Severe right foraminal stenosis at L2-3.
4. Small right facet joint effusion at L2-3 with reactive marrow
edema adjacent to the right L2-3 facet joint.

## 2021-09-03 ENCOUNTER — Other Ambulatory Visit: Payer: Self-pay | Admitting: Family Medicine

## 2021-09-08 NOTE — Telephone Encounter (Signed)
Pt called to check status on refill. States he took last pill this morning. Needs to have available to pick up by tomorrow morning.

## 2021-09-09 ENCOUNTER — Other Ambulatory Visit: Payer: Self-pay | Admitting: Family Medicine

## 2021-09-09 MED ORDER — LISINOPRIL-HYDROCHLOROTHIAZIDE 20-12.5 MG PO TABS: 1.0000 | ORAL_TABLET | Freq: Every day | ORAL | 1 refills | Status: DC

## 2021-09-09 NOTE — Telephone Encounter (Signed)
Pt called stating he still have not received his refill. States that he took his last pill yesterday morning

## 2021-11-04 ENCOUNTER — Other Ambulatory Visit: Payer: Self-pay | Admitting: Neurosurgery

## 2021-11-26 ENCOUNTER — Other Ambulatory Visit: Payer: Self-pay

## 2021-11-26 ENCOUNTER — Encounter
Admission: RE | Admit: 2021-11-26 | Discharge: 2021-11-26 | Disposition: A | Discharge status: Home / Self Care | Payer: Medicare Other | Source: Ambulatory Visit | Attending: Neurosurgery | Admitting: Neurosurgery

## 2021-11-26 DIAGNOSIS — I10 Essential (primary) hypertension: Secondary | ICD-10-CM | POA: Diagnosis not present

## 2021-11-26 DIAGNOSIS — Z01818 Encounter for other preprocedural examination: Secondary | ICD-10-CM | POA: Insufficient documentation

## 2021-11-26 HISTORY — DX: Personal history of urinary calculi: Z87.442

## 2021-11-26 HISTORY — DX: Headache, unspecified: R51.9

## 2021-11-26 LAB — CBC
HCT: 45.8 % (ref 39.0–52.0)
Hemoglobin: 15.2 g/dL (ref 13.0–17.0)
MCH: 29 pg (ref 26.0–34.0)
MCHC: 33.2 g/dL (ref 30.0–36.0)
MCV: 87.2 fL (ref 80.0–100.0)
Platelets: 295 10*3/uL (ref 150–400)
RBC: 5.25 MIL/uL (ref 4.22–5.81)
RDW: 12.9 % (ref 11.5–15.5)
WBC: 6.6 10*3/uL (ref 4.0–10.5)
nRBC: 0 % (ref 0.0–0.2)

## 2021-11-26 LAB — TYPE AND SCREEN
ABO/RH(D): A NEG
Antibody Screen: NEGATIVE

## 2021-11-26 LAB — URINALYSIS, ROUTINE W REFLEX MICROSCOPIC
Bilirubin Urine: NEGATIVE
Glucose, UA: NEGATIVE mg/dL
Hgb urine dipstick: NEGATIVE
Ketones, ur: NEGATIVE mg/dL
Leukocytes,Ua: NEGATIVE
Nitrite: NEGATIVE
Protein, ur: 30 mg/dL — AB
Specific Gravity, Urine: >1.03 — ABNORMAL HIGH (ref 1.005–1.030)
pH: 5 (ref 5.0–8.0)

## 2021-11-26 LAB — BASIC METABOLIC PANEL
Anion gap: 7 (ref 5–15)
BUN: 22 mg/dL (ref 8–23)
CO2: 24 mmol/L (ref 22–32)
Calcium: 9.1 mg/dL (ref 8.9–10.3)
Chloride: 101 mmol/L (ref 98–111)
Creatinine, Ser: 1.13 mg/dL (ref 0.61–1.24)
GFR, Estimated: >60 mL/min (ref >60)
Glucose, Bld: 110 mg/dL — ABNORMAL HIGH (ref 70–99)
Potassium: 3.9 mmol/L (ref 3.5–5.1)
Sodium: 132 mmol/L — ABNORMAL LOW (ref 135–145)

## 2021-11-26 LAB — PROTIME-INR
INR: 1 (ref 0.8–1.2)
Prothrombin Time: 12.7 seconds (ref 11.4–15.2)

## 2021-11-26 LAB — SURGICAL PCR SCREEN
MRSA, PCR: NEGATIVE
Staphylococcus aureus: POSITIVE — AB

## 2021-11-26 LAB — APTT: aPTT: 30 seconds (ref 24–36)

## 2021-11-26 NOTE — Patient Instructions (Signed)
Your procedure is scheduled on:12-09-21 Wednesday Report to the Registration Desk on the 1st floor of the Medical Mall.Then proceed to the 2nd floor Surgery Desk in the Medical Mall To find out your arrival time, please call [phone] between 1PM - 3PM on:12-08-21 Tuesday  REMEMBER: Instructions that are not followed completely may result in serious medical risk, up to and including death; or upon the discretion of your surgeon and anesthesiologist your surgery may need to be rescheduled.  Do not eat food after midnight the night before surgery.  No gum chewing, lozengers or hard candies.  You may however, drink CLEAR liquids up to 2 hours before you are scheduled to arrive for your surgery. Do not drink anything within 2 hours of your scheduled arrival time.  Clear liquids include: - water  - apple juice without pulp - gatorade (not RED, PURPLE, OR BLUE) - black coffee or tea (Do NOT add milk or creamers to the coffee or tea) Do NOT drink anything that is not on this list.  TAKE THESE MEDICATIONS THE MORNING OF SURGERY WITH A SIP OF WATER: -gabapentin (NEURONTIN) 300 MG capsule  One week prior to surgery: Stop Anti-inflammatories (NSAIDS) such as Advil, Aleve, Ibuprofen, Motrin, Naproxen, Naprosyn and Aspirin based products such as Excedrin, Goodys Powder, BC Powder.You may however, continue to take Tylenol if needed for pain up until the day of surgery.  Stop ANY OVER THE COUNTER supplements/vitamins 7 days prior to surger  No Alcohol for 24 hours before or after surgery.  No Smoking including e-cigarettes for 24 hours prior to surgery.  No chewable tobacco products for at least 6 hours prior to surgery.  No nicotine patches on the day of surgery.  Do not use any "recreational" drugs for at least a week prior to your surgery.  Please be advised that the combination of cocaine and anesthesia may have negative outcomes, up to and including death. If you test positive for  cocaine, your surgery will be cancelled.  On the morning of surgery brush your teeth with toothpaste and water, you may rinse your mouth with mouthwash if you wish. Do not swallow any toothpaste or mouthwash.  Use CHG Soap as directed on instruction sheet.  Do not wear jewelry, make-up, hairpins, clips or nail polish.  Do not wear lotions, powders, or perfumes.   Do not shave body from the neck down 48 hours prior to surgery just in case you cut yourself which could leave a site for infection.  Also, freshly shaved skin may become irritated if using the CHG soap.  Contact lenses, hearing aids and dentures may not be worn into surgery.  Do not bring valuables to the hospital. [facility] is not responsible for any missing/lost belongings or valuables.   Notify your doctor if there is any change in your medical condition (cold, fever, infection).  Wear comfortable clothing (specific to your surgery type) to the hospital.  After surgery, you can help prevent lung complications by doing breathing exercises.  Take deep breaths and cough every 1-2 hours. Your doctor may order a device called an Incentive Spirometer to help you take deep breaths. When coughing or sneezing, hold a pillow firmly against your incision with both hands. This is called "splinting." Doing this helps protect your incision. It also decreases belly discomfort.  If you are being admitted to the hospital overnight, leave your suitcase in the car. After surgery it may be brought to your room.  If you are being discharged  the day of surgery, you will not be allowed to drive home. You will need a responsible adult (18 years or older) to drive you home and stay with you that night.   If you are taking public transportation, you will need to have a responsible adult (18 years or older) with you. Please confirm with your physician that it is acceptable to use public transportation.   Please call the Pre-admissions Testing  Dept. at [phone] if you have any questions about these instructions.  Surgery Visitation Policy:  Patients undergoing a surgery or procedure may have one family member or support person with them as long as that person is not COVID-19 positive or experiencing its symptoms.  That person may remain in the waiting area during the procedure and may rotate out with other people.  Inpatient Visitation:    Visiting hours are 7 a.m. to 8 p.m. Up to two visitors ages 16+ are allowed at one time in a patient room. The visitors may rotate out with other people during the day. Visitors must check out when they leave, or other visitors will not be allowed. One designated support person may remain overnight. The visitor must pass COVID-19 screenings, use hand sanitizer when entering and exiting the patient's room and wear a mask at all times, including in the patient's room. Patients must also wear a mask when staff or their visitor are in the room. Masking is required regardless of vaccination status.

## 2021-12-02 NOTE — Progress Notes (Signed)
This is a standard order for surgery 

## 2021-12-03 ENCOUNTER — Other Ambulatory Visit
Admission: RE | Admit: 2021-12-03 | Discharge: 2021-12-03 | Disposition: A | Discharge status: Home / Self Care | Payer: Medicare Other | Source: Ambulatory Visit | Attending: Neurosurgery | Admitting: Neurosurgery

## 2021-12-03 DIAGNOSIS — Z20822 Contact with and (suspected) exposure to covid-19: Secondary | ICD-10-CM

## 2021-12-04 ENCOUNTER — Other Ambulatory Visit: Payer: Self-pay

## 2021-12-04 ENCOUNTER — Other Ambulatory Visit: Payer: Self-pay | Admitting: Family Medicine

## 2021-12-04 ENCOUNTER — Other Ambulatory Visit
Admission: RE | Admit: 2021-12-04 | Discharge: 2021-12-04 | Disposition: A | Discharge status: Home / Self Care | Payer: Medicare Other | Source: Ambulatory Visit | Attending: Neurosurgery | Admitting: Neurosurgery

## 2021-12-04 DIAGNOSIS — Z01812 Encounter for preprocedural laboratory examination: Secondary | ICD-10-CM | POA: Insufficient documentation

## 2021-12-04 DIAGNOSIS — Z20822 Contact with and (suspected) exposure to covid-19: Secondary | ICD-10-CM | POA: Diagnosis not present

## 2021-12-04 LAB — SARS CORONAVIRUS 2 (TAT 6-24 HRS): SARS Coronavirus 2: NEGATIVE

## 2021-12-09 ENCOUNTER — Encounter
Admission: RE | Disposition: A | Discharge status: Home / Self Care | Payer: Self-pay | Source: Ambulatory Visit | Attending: Neurosurgery

## 2021-12-09 ENCOUNTER — Ambulatory Visit: Payer: Medicare Other | Admitting: Urgent Care

## 2021-12-09 ENCOUNTER — Ambulatory Visit: Payer: Medicare Other | Admitting: Certified Registered Nurse Anesthetist

## 2021-12-09 ENCOUNTER — Ambulatory Visit
Admission: RE | Admit: 2021-12-09 | Discharge: 2021-12-09 | Disposition: A | Discharge status: Home / Self Care | Payer: Medicare Other | Source: Ambulatory Visit | Attending: Neurosurgery | Admitting: Neurosurgery

## 2021-12-09 ENCOUNTER — Other Ambulatory Visit: Payer: Self-pay

## 2021-12-09 ENCOUNTER — Encounter: Payer: Self-pay | Admitting: Neurosurgery

## 2021-12-09 ENCOUNTER — Ambulatory Visit: Payer: Medicare Other

## 2021-12-09 DIAGNOSIS — Z419 Encounter for procedure for purposes other than remedying health state, unspecified: Secondary | ICD-10-CM

## 2021-12-09 DIAGNOSIS — G9589 Other specified diseases of spinal cord: Secondary | ICD-10-CM | POA: Insufficient documentation

## 2021-12-09 DIAGNOSIS — M4712 Other spondylosis with myelopathy, cervical region: Secondary | ICD-10-CM | POA: Diagnosis present

## 2021-12-09 DIAGNOSIS — M2578 Osteophyte, vertebrae: Secondary | ICD-10-CM | POA: Insufficient documentation

## 2021-12-09 DIAGNOSIS — I10 Essential (primary) hypertension: Secondary | ICD-10-CM | POA: Insufficient documentation

## 2021-12-09 DIAGNOSIS — M4802 Spinal stenosis, cervical region: Secondary | ICD-10-CM | POA: Insufficient documentation

## 2021-12-09 DIAGNOSIS — M4319 Spondylolisthesis, multiple sites in spine: Secondary | ICD-10-CM | POA: Diagnosis not present

## 2021-12-09 DIAGNOSIS — M48061 Spinal stenosis, lumbar region without neurogenic claudication: Secondary | ICD-10-CM | POA: Insufficient documentation

## 2021-12-09 DIAGNOSIS — M5106 Intervertebral disc disorders with myelopathy, lumbar region: Secondary | ICD-10-CM | POA: Insufficient documentation

## 2021-12-09 DIAGNOSIS — Z79899 Other long term (current) drug therapy: Secondary | ICD-10-CM | POA: Diagnosis not present

## 2021-12-09 HISTORY — PX: ANTERIOR CERVICAL DECOMP/DISCECTOMY FUSION: SHX1161

## 2021-12-09 LAB — ABO/RH: ABO/RH(D): A NEG

## 2021-12-09 IMAGING — RF DG CERVICAL SPINE 2 OR 3 VIEWS
1 series · 3 of 3 positions shown · non-contrast
Comparison: MRI [DATE]

CLINICAL DATA: C3 through 5 ACDF

EXAM:
CERVICAL SPINE - 2-3 VIEW

[Series 1: dg x-ray · 0.20mm/px · 3 of 3 slices shown]
[im 1/3]
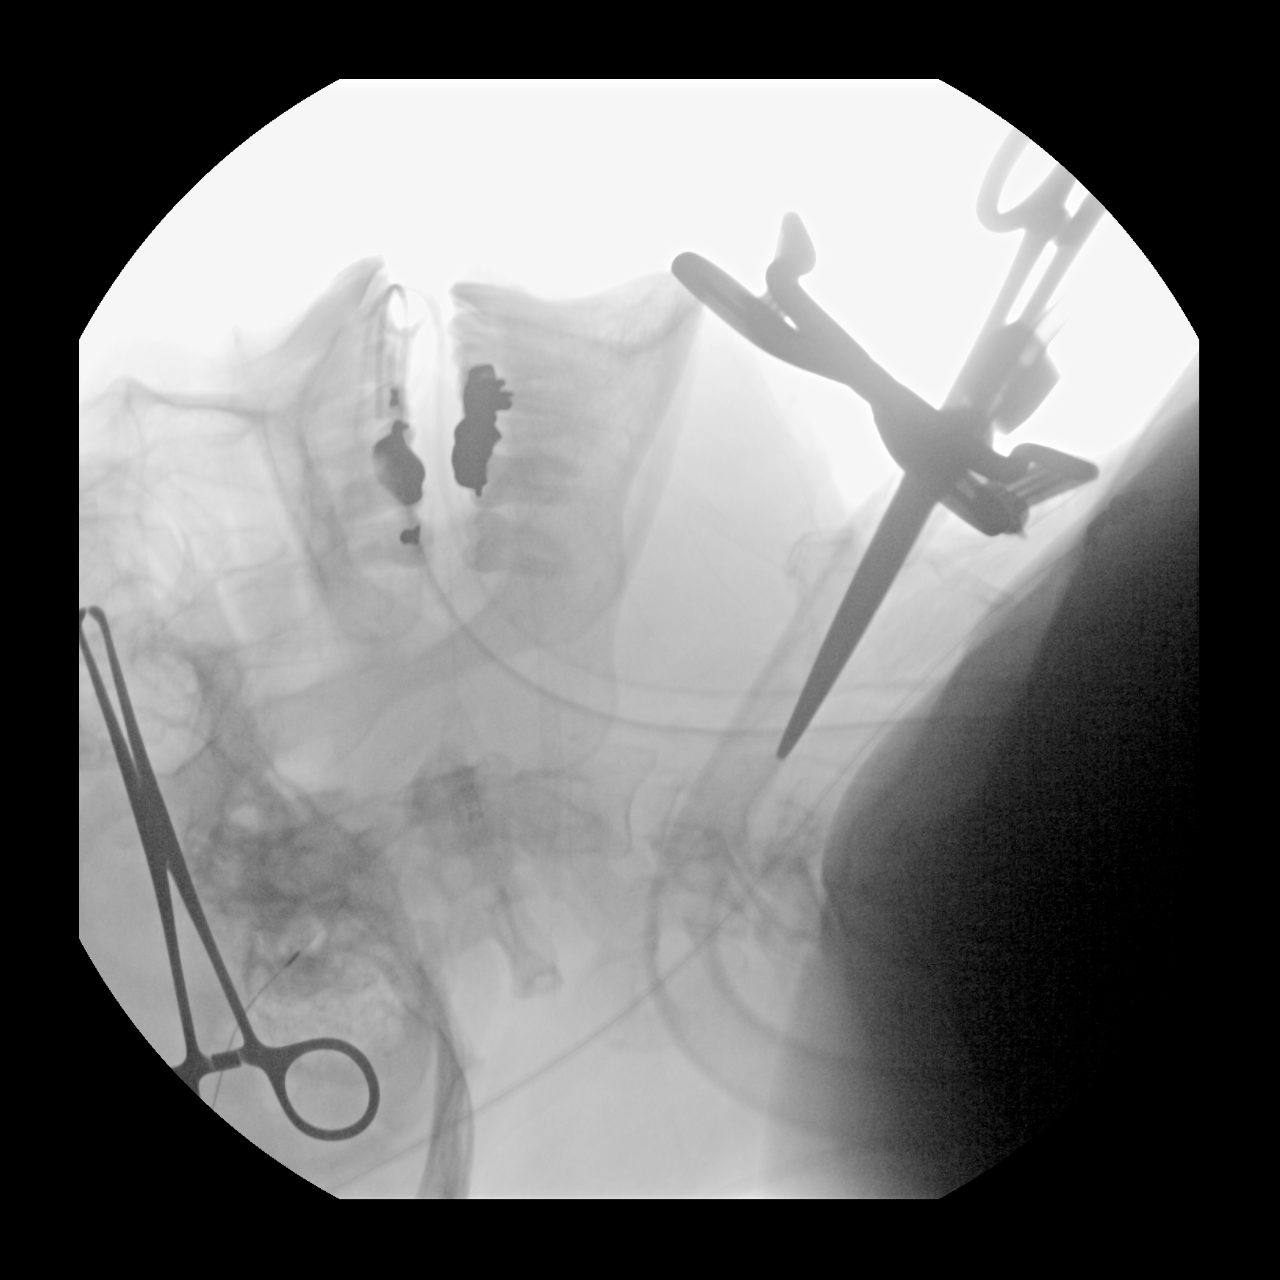
[im 2/3]
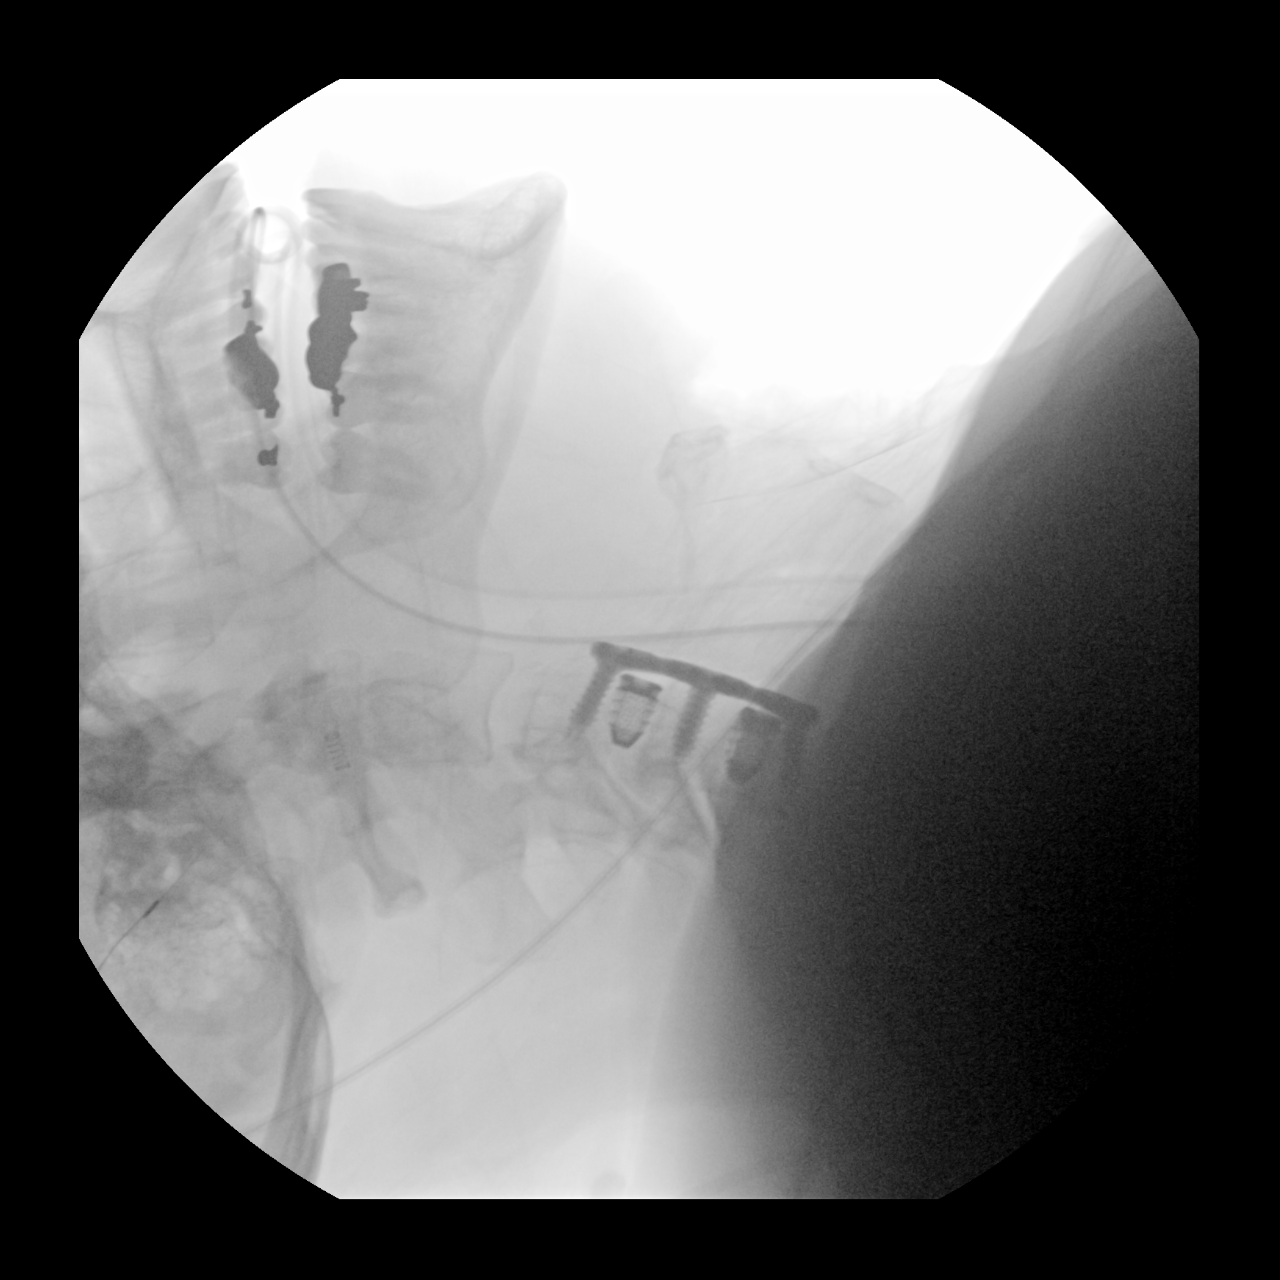
[im 3/3]
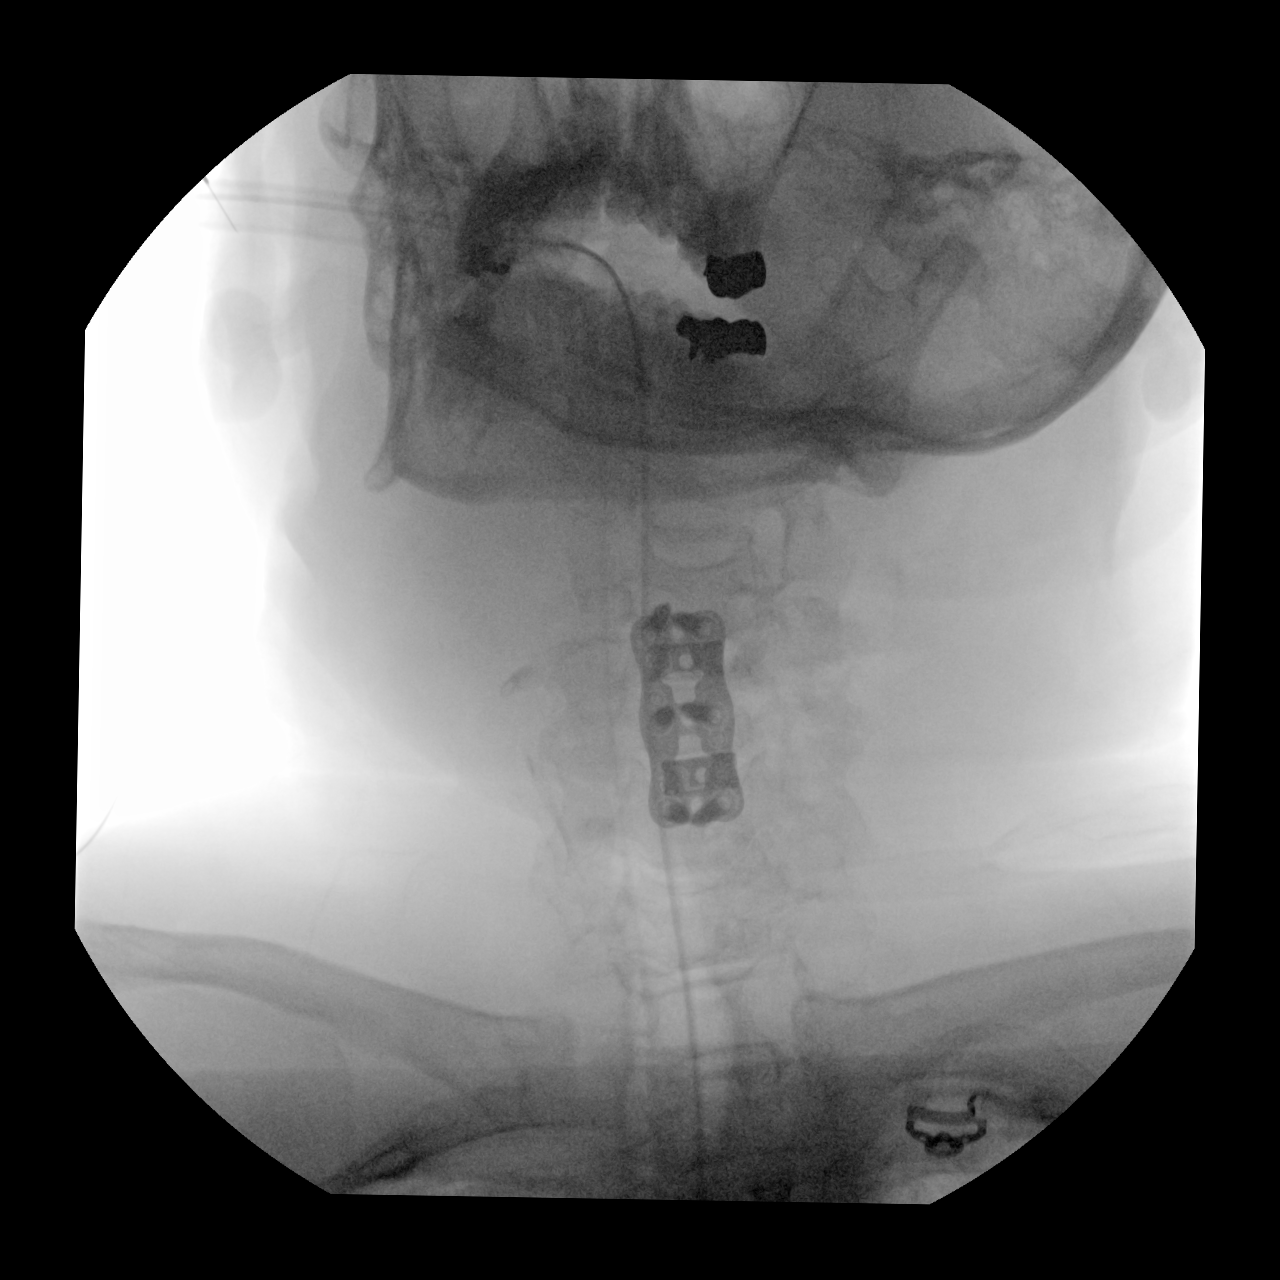

[3 of 3 positions shown; findings below may reference images not displayed]

FINDINGS: Three low resolution intraoperative spot views of the cervical
spine. Total fluoroscopy time was 1 second. Initial image
demonstrates surgical instruments anterior to the inferior aspect of
C3. Subsequent images demonstrate anterior plate and fixating screws
with interbody device from C3 through C5.
IMPRESSION: Intraoperative fluoroscopic assistance provided during cervical
spine surgery.

## 2021-12-09 SURGERY — ANTERIOR CERVICAL DECOMPRESSION/DISCECTOMY FUSION 2 LEVELS
Anesthesia: General

## 2021-12-09 MED ORDER — REMIFENTANIL HCL 1 MG IV SOLR
INTRAVENOUS | Status: DC | PRN
  Administered 2021-12-09: .05 ug/kg/min via INTRAVENOUS

## 2021-12-09 MED ORDER — MIDAZOLAM HCL 2 MG/2ML IJ SOLN
INTRAMUSCULAR | Status: DC | PRN
  Administered 2021-12-09: 2 mg via INTRAVENOUS

## 2021-12-09 MED ORDER — METHOCARBAMOL 500 MG PO TABS
ORAL_TABLET | ORAL | Status: AC
  Filled 2021-12-09: qty 1

## 2021-12-09 MED ORDER — PROPOFOL 1000 MG/100ML IV EMUL
INTRAVENOUS | Status: AC
  Filled 2021-12-09: qty 100

## 2021-12-09 MED ORDER — OXYCODONE HCL 5 MG/5ML PO SOLN: 5.0000 mg | Freq: Once | ORAL | Status: AC | PRN

## 2021-12-09 MED ORDER — CHLORHEXIDINE GLUCONATE 0.12 % MT SOLN
OROMUCOSAL | Status: AC
  Administered 2021-12-09: 10:00:00 15 mL via OROMUCOSAL
  Filled 2021-12-09: qty 15

## 2021-12-09 MED ORDER — LACTATED RINGERS IV SOLN: INTRAVENOUS | Status: DC | PRN

## 2021-12-09 MED ORDER — VASOPRESSIN 20 UNIT/ML IV SOLN
INTRAVENOUS | Status: DC | PRN
  Administered 2021-12-09: 2 [IU] via INTRAVENOUS
  Administered 2021-12-09 (×3): 1 [IU] via INTRAVENOUS

## 2021-12-09 MED ORDER — REMIFENTANIL HCL 1 MG IV SOLR
INTRAVENOUS | Status: AC
  Filled 2021-12-09: qty 1000

## 2021-12-09 MED ORDER — ORAL CARE MOUTH RINSE: 15.0000 mL | Freq: Once | OROMUCOSAL | Status: AC

## 2021-12-09 MED ORDER — SENNA 8.6 MG PO TABS: 1.0000 | ORAL_TABLET | Freq: Every day | ORAL | 0 refills | Status: DC | PRN

## 2021-12-09 MED ORDER — CEFAZOLIN SODIUM-DEXTROSE 2-4 GM/100ML-% IV SOLN
INTRAVENOUS | Status: AC
  Filled 2021-12-09: qty 100

## 2021-12-09 MED ORDER — CHLORHEXIDINE GLUCONATE 0.12 % MT SOLN: 15.0000 mL | Freq: Once | OROMUCOSAL | Status: AC

## 2021-12-09 MED ORDER — SUCCINYLCHOLINE CHLORIDE 200 MG/10ML IV SOSY
PREFILLED_SYRINGE | INTRAVENOUS | Status: DC | PRN
  Administered 2021-12-09: 120 mg via INTRAVENOUS

## 2021-12-09 MED ORDER — PROPOFOL 10 MG/ML IV BOLUS
INTRAVENOUS | Status: DC | PRN
  Administered 2021-12-09: 200 mg via INTRAVENOUS

## 2021-12-09 MED ORDER — PROPOFOL 500 MG/50ML IV EMUL
INTRAVENOUS | Status: DC | PRN
  Administered 2021-12-09: 100 ug/kg/min via INTRAVENOUS

## 2021-12-09 MED ORDER — MIDAZOLAM HCL 2 MG/2ML IJ SOLN
INTRAMUSCULAR | Status: AC
  Filled 2021-12-09: qty 2

## 2021-12-09 MED ORDER — OXYCODONE HCL 5 MG PO TABS: 5.0000 mg | ORAL_TABLET | Freq: Four times a day (QID) | ORAL | 0 refills | Status: AC | PRN

## 2021-12-09 MED ORDER — ACETAMINOPHEN 10 MG/ML IV SOLN
INTRAVENOUS | Status: DC | PRN
  Administered 2021-12-09: 1000 mg via INTRAVENOUS

## 2021-12-09 MED ORDER — METHOCARBAMOL 500 MG PO TABS
500.0000 mg | ORAL_TABLET | Freq: Three times a day (TID) | ORAL | Status: DC | PRN
  Administered 2021-12-09: 17:00:00 500 mg via ORAL

## 2021-12-09 MED ORDER — BUPIVACAINE-EPINEPHRINE (PF) 0.5% -1:200000 IJ SOLN
INTRAMUSCULAR | Status: AC
  Filled 2021-12-09: qty 30

## 2021-12-09 MED ORDER — DEXAMETHASONE SODIUM PHOSPHATE 10 MG/ML IJ SOLN
INTRAMUSCULAR | Status: DC | PRN
  Administered 2021-12-09: 10 mg via INTRAVENOUS

## 2021-12-09 MED ORDER — FAMOTIDINE 20 MG PO TABS
ORAL_TABLET | ORAL | Status: AC
  Administered 2021-12-09: 10:00:00 20 mg via ORAL
  Filled 2021-12-09: qty 1

## 2021-12-09 MED ORDER — LIDOCAINE HCL (CARDIAC) PF 100 MG/5ML IV SOSY
PREFILLED_SYRINGE | INTRAVENOUS | Status: DC | PRN
  Administered 2021-12-09: 100 mg via INTRAVENOUS

## 2021-12-09 MED ORDER — PHENYLEPHRINE 40 MCG/ML (10ML) SYRINGE FOR IV PUSH (FOR BLOOD PRESSURE SUPPORT)
PREFILLED_SYRINGE | INTRAVENOUS | Status: DC | PRN
  Administered 2021-12-09 (×2): 160 ug via INTRAVENOUS
  Administered 2021-12-09: 80 ug via INTRAVENOUS

## 2021-12-09 MED ORDER — SURGIFLO WITH THROMBIN (HEMOSTATIC MATRIX KIT) OPTIME
TOPICAL | Status: DC | PRN
  Administered 2021-12-09: 1 via TOPICAL

## 2021-12-09 MED ORDER — 0.9 % SODIUM CHLORIDE (POUR BTL) OPTIME
TOPICAL | Status: DC | PRN
  Administered 2021-12-09: 13:00:00 500 mL

## 2021-12-09 MED ORDER — FENTANYL CITRATE (PF) 100 MCG/2ML IJ SOLN
INTRAMUSCULAR | Status: DC | PRN
  Administered 2021-12-09: 100 ug via INTRAVENOUS

## 2021-12-09 MED ORDER — FENTANYL CITRATE (PF) 100 MCG/2ML IJ SOLN
INTRAMUSCULAR | Status: AC
  Filled 2021-12-09: qty 2

## 2021-12-09 MED ORDER — ONDANSETRON HCL 4 MG/2ML IJ SOLN
INTRAMUSCULAR | Status: DC | PRN
  Administered 2021-12-09: 4 mg via INTRAVENOUS

## 2021-12-09 MED ORDER — PHENYLEPHRINE HCL-NACL 20-0.9 MG/250ML-% IV SOLN
INTRAVENOUS | Status: DC | PRN
  Administered 2021-12-09: 60 ug/min via INTRAVENOUS
  Administered 2021-12-09: 40 ug/min via INTRAVENOUS

## 2021-12-09 MED ORDER — EPHEDRINE SULFATE 50 MG/ML IJ SOLN
INTRAMUSCULAR | Status: DC | PRN
  Administered 2021-12-09 (×2): 5 mg via INTRAVENOUS

## 2021-12-09 MED ORDER — FENTANYL CITRATE (PF) 100 MCG/2ML IJ SOLN
25.0000 ug | INTRAMUSCULAR | Status: DC | PRN
  Administered 2021-12-09 (×3): 25 ug via INTRAVENOUS

## 2021-12-09 MED ORDER — BUPIVACAINE-EPINEPHRINE (PF) 0.5% -1:200000 IJ SOLN
INTRAMUSCULAR | Status: DC | PRN
  Administered 2021-12-09: 7 mL via PERINEURAL

## 2021-12-09 MED ORDER — OXYCODONE HCL 5 MG PO TABS
5.0000 mg | ORAL_TABLET | Freq: Once | ORAL | Status: AC | PRN
  Administered 2021-12-09: 16:00:00 5 mg via ORAL

## 2021-12-09 MED ORDER — CEFAZOLIN SODIUM-DEXTROSE 2-4 GM/100ML-% IV SOLN
2.0000 g | Freq: Once | INTRAVENOUS | Status: AC
  Administered 2021-12-09: 12:00:00 2 g via INTRAVENOUS

## 2021-12-09 MED ORDER — FAMOTIDINE 20 MG PO TABS: 20.0000 mg | ORAL_TABLET | Freq: Once | ORAL | Status: AC

## 2021-12-09 MED ORDER — METHOCARBAMOL 500 MG PO TABS: 500.0000 mg | ORAL_TABLET | Freq: Four times a day (QID) | ORAL | 0 refills | Status: DC

## 2021-12-09 MED ORDER — LACTATED RINGERS IV SOLN: INTRAVENOUS | Status: DC

## 2021-12-09 MED ORDER — OXYCODONE HCL 5 MG PO TABS
ORAL_TABLET | ORAL | Status: AC
  Filled 2021-12-09: qty 1

## 2021-12-09 MED ORDER — FENTANYL CITRATE (PF) 100 MCG/2ML IJ SOLN
INTRAMUSCULAR | Status: AC
  Administered 2021-12-09: 15:00:00 25 ug via INTRAVENOUS
  Filled 2021-12-09: qty 2

## 2021-12-09 SURGICAL SUPPLY — 67 items
ADH SKN CLS APL DERMABOND .7 (GAUZE/BANDAGES/DRESSINGS) ×1
AGENT HMST KT MTR STRL THRMB (HEMOSTASIS) ×1
APL PRP STRL LF DISP 70% ISPRP (MISCELLANEOUS) ×2
BASKET BONE COLLECTION (BASKET) IMPLANT
BLADE BOVIE TIP EXT 4 (BLADE) ×1 IMPLANT
BULB RESERV EVAC DRAIN JP 100C (MISCELLANEOUS) IMPLANT
BUR NEURO DRILL SOFT 3.0X3.8M (BURR) ×2 IMPLANT
CHLORAPREP W/TINT 26 (MISCELLANEOUS) ×4 IMPLANT
COUNTER NEEDLE 20/40 LG (NEEDLE) ×2 IMPLANT
CUP MEDICINE 2OZ PLAST GRAD ST (MISCELLANEOUS) ×2 IMPLANT
DERMABOND ADVANCED (GAUZE/BANDAGES/DRESSINGS) ×1
DERMABOND ADVANCED .7 DNX12 (GAUZE/BANDAGES/DRESSINGS) ×1 IMPLANT
DRAIN CHANNEL JP 10F RND 20C F (MISCELLANEOUS) IMPLANT
DRAPE C ARM PK CFD 31 SPINE (DRAPES) ×2 IMPLANT
DRAPE LAPAROTOMY 77X122 PED (DRAPES) ×2 IMPLANT
DRAPE MICROSCOPE SPINE 48X150 (DRAPES) ×2 IMPLANT
DRAPE SURG 17X11 SM STRL (DRAPES) ×8 IMPLANT
ELECT CAUTERY BLADE TIP 2.5 (TIP) ×2
ELECT REM PT RETURN 9FT ADLT (ELECTROSURGICAL) ×2
ELECTRODE CAUTERY BLDE TIP 2.5 (TIP) ×1 IMPLANT
ELECTRODE REM PT RTRN 9FT ADLT (ELECTROSURGICAL) ×1 IMPLANT
FEE INTRAOP CADWELL SUPPLY NCS (MISCELLANEOUS) ×1 IMPLANT
FEE INTRAOP MONITOR IMPULS NCS (MISCELLANEOUS) IMPLANT
GAUZE 4X4 16PLY ~~LOC~~+RFID DBL (SPONGE) ×2 IMPLANT
GLOVE SURG SYN 6.5 ES PF (GLOVE) ×4 IMPLANT
GLOVE SURG SYN 6.5 PF PI (GLOVE) ×2 IMPLANT
GLOVE SURG SYN 8.5  E (GLOVE) ×6
GLOVE SURG SYN 8.5 E (GLOVE) ×3 IMPLANT
GLOVE SURG SYN 8.5 PF PI (GLOVE) ×3 IMPLANT
GLOVE SURG UNDER POLY LF SZ6.5 (GLOVE) ×4 IMPLANT
GOWN SRG LRG LVL 4 IMPRV REINF (GOWNS) ×2 IMPLANT
GOWN SRG XL LVL 3 NONREINFORCE (GOWNS) ×1 IMPLANT
GOWN STRL NON-REIN TWL XL LVL3 (GOWNS) ×2
GOWN STRL REIN LRG LVL4 (GOWNS) ×4
GRADUATE 1200CC STRL 31836 (MISCELLANEOUS) ×2 IMPLANT
INTRAOP CADWELL SUPPLY FEE NCS (MISCELLANEOUS) ×1
INTRAOP DISP SUPPLY FEE NCS (MISCELLANEOUS) ×2
INTRAOP MONITOR FEE IMPULS NCS (MISCELLANEOUS) ×1
INTRAOP MONITOR FEE IMPULSE (MISCELLANEOUS) ×2
KIT TURNOVER KIT A (KITS) ×2 IMPLANT
MANIFOLD NEPTUNE II (INSTRUMENTS) ×2 IMPLANT
MARKER SKIN DUAL TIP RULER LAB (MISCELLANEOUS) ×4 IMPLANT
NDL SAFETY ECLIPSE 18X1.5 (NEEDLE) ×1 IMPLANT
NEEDLE HYPO 18GX1.5 SHARP (NEEDLE) ×2
NEEDLE HYPO 22GX1.5 SAFETY (NEEDLE) ×2 IMPLANT
NS IRRIG 500ML POUR BTL (IV SOLUTION) ×1 IMPLANT
PACK LAMINECTOMY NEURO (CUSTOM PROCEDURE TRAY) ×2 IMPLANT
PAD ARMBOARD 7.5X6 YLW CONV (MISCELLANEOUS) ×4 IMPLANT
PIN CASPAR 14 (PIN) ×1 IMPLANT
PIN CASPAR 14MM (PIN) ×2
PLATE ANT CERV XTEND 2 LV 32 (Plate) ×1 IMPLANT
PUTTY DBX 1CC (Putty) ×2 IMPLANT
PUTTY DBX 1CC DEPUY (Putty) IMPLANT
SCREW VAR 4.2 XD SELF DRILL 16 (Screw) ×6 IMPLANT
SPACER C HEDRON 12X14 7M 7D (Spacer) ×2 IMPLANT
SPONGE KITTNER 5P (MISCELLANEOUS) ×2 IMPLANT
STAPLER SKIN PROX 35W (STAPLE) IMPLANT
SURGIFLO W/THROMBIN 8M KIT (HEMOSTASIS) ×2 IMPLANT
SUT BONE WAX W31G (SUTURE) ×1 IMPLANT
SUT V-LOC 90 ABS DVC 3-0 CL (SUTURE) ×2 IMPLANT
SUT VIC AB 3-0 SH 8-18 (SUTURE) ×3 IMPLANT
SYR 30ML LL (SYRINGE) ×2 IMPLANT
TAPE CLOTH 3X10 WHT NS LF (GAUZE/BANDAGES/DRESSINGS) ×2 IMPLANT
TOWEL OR 17X26 4PK STRL BLUE (TOWEL DISPOSABLE) ×6 IMPLANT
TRAY FOLEY MTR SLVR 16FR STAT (SET/KITS/TRAYS/PACK) IMPLANT
TUBING CONNECTING 10 (TUBING) ×2 IMPLANT
WATER STERILE IRR 500ML POUR (IV SOLUTION) ×1 IMPLANT

## 2021-12-09 NOTE — H&P (Signed)
History of Present Illness: 12/09/2021 Eric Nixon presents today for surgical intervention.  09/04/2021 Eric Nixon is here today with a chief complaint of neck pain and numbness into the first 3 digits of his right hand first 2 digits of his left hand. He has had numbness in his left hand and right hand for several years, and had EMG confirmed carpal tunnel syndrome for which he underwent carpal tunnel release several years ago. His symptoms have not really improved from that.  More recently, he has developed some issues with his dexterity. He is having some trouble managing buttons and zippers. He is also having some issues with his balance and has started using a walking stick. He has not had any falls, but has had a noted change in his mobility. He does have some weakness with opening jars.  He does have leg symptoms including low back pain and discomfort when he walks for more than a couple 100 yards. He is able to walk up to a mile with discomfort, but it is not the same as it was previously. He has discomfort into his bilateral buttocks. He is no longer able to be as physically active as he was in the past.  Conservative measures:  Physical therapy: has not participated Multimodal medical therapy including regular antiinflammatories: gabapentin, ibuprofen, prednisone Injections: has not received epidural steroid injections  Past Surgery: bilateral carpal tunnel decompression in 2018  Eric Nixon has clear symptoms of cervical myelopathy.  The symptoms are causing a significant impact on the patient's life.   Review of Systems:  A 10 point review of systems is negative, except for the pertinent positives and negatives detailed in the HPI.  Past Medical History: Past Medical History:  Diagnosis Date   Arthritis   B12 deficiency   ED (erectile dysfunction)   Hyperglycemia   Hypertension   Peripheral neuropathy   Past Surgical History: Past Surgical History:  Procedure  Laterality Date   ankle surgery   ENDOSCOPIC CARPAL TUNNEL RELEASE Bilateral 2018   No Known Allergies   Current Meds  Medication Sig   gabapentin (NEURONTIN) 300 MG capsule Take 300 mg by mouth 3 (three) times daily.   ibuprofen (ADVIL) 200 MG tablet Take 1-2 tablets (200-400 mg total) by mouth every 6 (six) hours as needed.   lisinopril-hydrochlorothiazide (ZESTORETIC) 20-12.5 MG tablet TAKE 1 TABLET BY MOUTH EVERY DAY   [DISCONTINUED] lisinopril-hydrochlorothiazide (ZESTORETIC) 20-12.5 MG tablet Take 1 tablet by mouth daily. (Patient taking differently: Take 1 tablet by mouth every morning.)   Social History: Social History   Tobacco Use   Smoking status: Never Smoker   Smokeless tobacco: Never Used  Building services engineer Use: Never used  Substance Use Topics   Alcohol use: Not Currently  Comment: rare   Family Medical History: Family History  Problem Relation Age of Onset   Arthritis Mother   Brain cancer Mother   Arthritis Father   Bladder Cancer Father   Arthritis Maternal Aunt   Arthritis Maternal Uncle   Arthritis Paternal Aunt   Arthritis Paternal Uncle   Arthritis Maternal Grandmother   Arthritis Maternal Grandfather   Arthritis Paternal Grandmother   Arthritis Paternal Grandfather   Physical Examination:  Vitals:   12/09/21 1013  BP: (!) 120/100  Pulse: 92  Resp: 16  Temp: (!) 97.3 F (36.3 C)  SpO2: 99%    Heart sounds normal no MRG. Chest Clear to Auscultation Bilaterally.   General: Patient is well developed, well nourished,  calm, collected, and in no apparent distress. Attention to examination is appropriate.  Psychiatric: Patient is non-anxious.  Head: Pupils equal, round, and reactive to light.  ENT: Oral mucosa appears well hydrated.  Neck: Supple.   Respiratory: Patient is breathing without any difficulty.  Extremities: No edema.  Vascular: Palpable dorsal pedal pulses.  Skin: On exposed skin, there are no abnormal skin  lesions.  NEUROLOGICAL:   Awake, alert, oriented to person, place, and time. Speech is clear and fluent. Fund of knowledge is appropriate.   Cranial Nerves: Pupils equal round and reactive to light. Facial tone is symmetric. Facial sensation is symmetric. Shoulder shrug is symmetric. Tongue protrusion is midline. There is no pronator drift.  Strength: Side Biceps Triceps Deltoid Interossei Grip Wrist Ext. Wrist Flex.  R 5 5 5  4+ 4+ 5 5  L 5 5 5  4+ 4+ 5 5   Side Iliopsoas Quads Hamstring PF DF EHL  R 5 5 5 5 5 5   L 5 5 5 5 5 5    Reflexes are 2+ and symmetric at the biceps, triceps, brachioradialis, 1+ patella and achilles. 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   Bilateral upper and lower extremity sensation is intact to light touch except decreased from the PIP to the fingertips in the 1-3 digits on the R and 1-2 digits on the left.  Gait is abnormal - wide-based.  Moderate difficulty with tandem gait.   Medical Decision Making  Imaging: MRI CL spine 09/02/2021 C2-C3: Negative disc. Mild bilateral facet arthropathy. No stenosis.   C3-C4: New left paracentral disc osteophyte complex. Progressive  severe left and moderate right facet arthropathy. New mild spinal  canal stenosis. Worsened severe left neuroforaminal stenosis. New  severe right neuroforaminal stenosis.   C4-C5: Mild disc bulging. Progressive moderate bilateral facet  arthropathy. New mild bilateral neuroforaminal stenosis. No spinal  canal stenosis.   C5-C6: Unchanged small posterior disc osteophyte complex and  mild-to-moderate bilateral facet arthropathy. Unchanged moderate  left neuroforaminal stenosis. No spinal canal right neuroforaminal  stenosis.   C6-C7: Unchanged small posterior disc osteophyte complex and  bilateral uncovertebral hypertrophy. Unchanged moderate left and  mild right neuroforaminal stenosis. No spinal canal stenosis.   C7-T1: Unchanged small posterior disc osteophyte complex and   mild-to-moderate bilateral facet uncovertebral hypertrophy.  Unchanged moderate bilateral neuroforaminal stenosis. No spinal  canal stenosis.   IMPRESSION:  1. Progressive multilevel cervical spondylosis as described above.  New cord atrophy and myelomalacia on the left at C3-C4.  2. Progressive severe left and new severe right neuroforaminal  stenosis at C3-C4.   Electronically Signed    By: M.D.    On: 09/03/2021 16:26  IMPRESSION:  1. Advanced multilevel lumbar spondylosis, progressed from prior MRI  04/13/2017.  2. Severe canal stenosis at the L2-3, L3-4, and L4-5 levels.  Moderate-to-severe canal stenosis at L1-2.  3. Severe right foraminal stenosis at L2-3.  4. Small right facet joint effusion at L2-3 with reactive marrow  edema adjacent to the right L2-3 facet joint.   Electronically Signed    By: 11/02/2021 D.O.    On: 09/03/2021 16:24  I have personally reviewed the images and agree with the above interpretation with the exception that he has at least moderate stenosis at the C3-4 level including substantial deformation of the spinal cord and myelomalacia.  Assessment and Plan: Eric Nixon is a pleasant 65 y.o. male with cervical myelopathy due to cervical stenosis at C3-4 and anterolisthesis of C4-5.  He also has multilevel lumbar degenerative disc disease with severe stenosis at L2-3, L3-4, and L4-5. He has anterolisthesis of L4-5. He has moderate to severe stenosis at L1-2.  He has an area of myelomalacia on the left side of the spinal cord at C3-4. He has at least moderate stenosis at that level. Given his changing symptoms and clinical myelopathy, I recommended surgical intervention with C3-5 ACDF.   Venetia Night MD, [facility] Department of Neurosurgery

## 2021-12-09 NOTE — Anesthesia Preprocedure Evaluation (Signed)
Anesthesia Evaluation  Patient identified by MRN, date of birth, ID band Patient awake    Reviewed: Allergy & Precautions, H&P , NPO status , Patient's Chart, lab work & pertinent test results  History of Anesthesia Complications Negative for: history of anesthetic complications  Airway Mallampati: III  TM Distance: >3 FB Neck ROM: limited    Dental   Pulmonary neg pulmonary ROS, neg sleep apnea, neg COPD,    breath sounds clear to auscultation       Cardiovascular hypertension, (-) angina(-) Past MI and (-) Cardiac Stents (-) dysrhythmias  Rhythm:regular Rate:Normal     Neuro/Psych  Headaches, Cervical myelopathy negative psych ROS   GI/Hepatic negative GI ROS, Neg liver ROS,   Endo/Other  negative endocrine ROS  Renal/GU      Musculoskeletal   Abdominal   Peds  Hematology negative hematology ROS (+)   Anesthesia Other Findings Past Medical History: No date: B12 deficiency No date: Cervical spine disease No date: ED (erectile dysfunction) No date: Headache No date: History of kidney stones No date: Hyperglycemia No date: Hypertension No date: Osteoarthritis of both hands No date: Peripheral neuropathy No date: Spinal stenosis of lumbar region  Past Surgical History: 08/2008: ANKLE SURGERY     Comment:  fx 2017: CARPAL TUNNEL RELEASE; Bilateral  BMI    Body Mass Index: 38.03 kg/m      Reproductive/Obstetrics negative OB ROS                             Anesthesia Physical Anesthesia Plan  ASA: 3  Anesthesia Plan: General ETT   Post-op Pain Management:    Induction:   PONV Risk Score and Plan: Ondansetron, Dexamethasone, Treatment may vary due to age or medical condition and Midazolam  Airway Management Planned: Video Laryngoscope Planned  Additional Equipment:   Intra-op Plan:   Post-operative Plan:   Informed Consent: I have reviewed the patients History and  Physical, chart, labs and discussed the procedure including the risks, benefits and alternatives for the proposed anesthesia with the patient or authorized representative who has indicated his/her understanding and acceptance.     Dental Advisory Given  Plan Discussed with: Anesthesiologist, CRNA and Surgeon  Anesthesia Plan Comments:         Anesthesia Quick Evaluation

## 2021-12-09 NOTE — Discharge Instructions (Addendum)
°Your surgeon has performed an operation on your cervical spine (neck) to relieve pressure on the spinal cord and/or nerves. This involved making an incision in the front of your neck and removing one or more of the discs that support your spine. Next, a small piece of bone, a titanium plate, and screws were used to fuse two or more of the vertebrae (bones) together. ° °The following are instructions to help in your recovery once you have been discharged from the hospital. Even if you feel well, it is important that you follow these activity guidelines. If you do not let your neck heal properly from the surgery, you can increase the chance of return of your symptoms and other complications. ° °* Do not take anti-inflammatory medications for 3 months after surgery (naproxen [Aleve], ibuprofen [Advil, Motrin], etc.). These medications can prevent your bones from healing properly. ° °Activity  °  °No bending, lifting, or twisting (“BLT”). Avoid lifting objects heavier than 10 pounds (gallon milk jug).  Where possible, avoid household activities that involve lifting, bending, reaching, pushing, or pulling such as laundry, vacuuming, grocery shopping, and childcare. Try to arrange for help from friends and family for these activities while your back heals. ° °Increase physical activity slowly as tolerated.  Taking short walks is encouraged, but avoid strenuous exercise. Do not jog, run, bicycle, lift weights, or participate in any other exercises unless specifically allowed by your doctor. ° °Talk to your doctor before resuming sexual activity. ° °You should not drive until cleared by your doctor. ° °Until released by your doctor, you should not return to work or school.  You should rest at home and let your body heal.  ° °You may shower two days after your surgery.  After showering, lightly dab your incision dry. Do not take a tub bath or go swimming until approved by your doctor at your follow-up appointment. ° °If  your doctor ordered a cervical collar (neck brace) for you, you should wear it whenever you are out of bed. You may remove it when lying down or sleeping, but you should wear it at all other times. Not all neck surgeries require a cervical collar. ° °If you smoke, we strongly recommend that you quit.  Smoking has been proven to interfere with normal bone healing and will dramatically reduce the success rate of your surgery. Please contact [facility] (800-QUIT-NOW) and use the resources at www.[facility].com for assistance in stopping smoking. ° °Surgical Incision °  °Keep your incision area clean and dry. ° °Your incision was closed with Dermabond glue. The glue should begin to peel away within about a week. ° °Diet          ° °You may return to your usual diet. However, you may experience discomfort when swallowing in the first month after your surgery. This is normal. You may find that softer foods are more comfortable for you to swallow. Be sure to stay hydrated. ° °When to Contact Us ° °You may experience pain in your neck and/or pain between your shoulder blades. This is normal and should improve in the next few weeks with the help of pain medication, muscle relaxers, and rest. Some patients report that a warm compress on the back of the neck or between the shoulder blades helps. ° °However, should you experience any of the following, contact us immediately: °New numbness or weakness °Pain that is progressively getting worse, and is not relieved by your pain medication, muscle relaxers, rest, and warm compresses °  Bleeding, redness, swelling, pain, or drainage from surgical incision °Chills or flu-like symptoms °Fever greater than 101.0 F (38.3 C) °Inability to eat, drink fluids, or take medications °Problems with bowel or bladder functions °Difficulty breathing or shortness of breath °Warmth, tenderness, or swelling in your calf °Contact Information °During office hours (Monday-Friday 9 am to 5 pm), please call  your physician at [phone] and ask for Kendelyn Jean °After hours and weekends, please call [phone] and speak with the answering service, who will contact the doctor on call.  If that fails, call the [facility] Operator at [phone] and ask for the Neurosurgery Resident On Call  °For a life-threatening emergency, call 911 ° °  ° °AMBULATORY SURGERY  °DISCHARGE INSTRUCTIONS ° ° °The drugs that you were given will stay in your system until tomorrow so for the next 24 hours you should not: ° °Drive an automobile °Make any legal decisions °Drink any alcoholic beverage ° ° °You may resume regular meals tomorrow.  Today it is better to start with liquids and gradually work up to solid foods. ° °You may eat anything you prefer, but it is better to start with liquids, then soup and crackers, and gradually work up to solid foods. ° ° °Please notify your doctor immediately if you have any unusual bleeding, trouble breathing, redness and pain at the surgery site, drainage, fever, or pain not relieved by medication. ° ° ° °Additional Instructions: ° °Please contact your physician with any problems or Same Day Surgery at [phone], Monday through Friday 6 am to 4 pm, or [facility] Main number at [phone].  °

## 2021-12-09 NOTE — Discharge Summary (Signed)
Physician Discharge Summary  Patient ID: Eric Nixon MRN: [id number] DOB/AGE: [birth year] 66 y.o.  Admit date: 12/09/2021 Discharge date: 12/09/2021  Admission Diagnoses: cervical myelopathy   Discharge Diagnoses:  Active Problems:   * No active hospital problems. *   Discharged Condition: good  Hospital Course:  Eric Nixon is a 65 y.o presenting with symptoms of cervical myelopathy. He underwent a C3-5 ACDF. His interoperative course was uncomplicated. He was monitored post-operative in PACU for 4 hours and discharged home after ambulating, urinating, and tolerating PO intake. He was given prescriptions for Oxycodone, Robaxin, and Senna.   Consults: None  Significant Diagnostic Studies: none  Treatments: surgery: as above. See separately dictated operative report for further details.   Discharge Exam: Blood pressure (!) 128/95, pulse 76, temperature (!) 97.1 F (36.2 C), resp. rate 11, height 5\' 9"  (1.753 m), weight 116.8 kg, SpO2 92 %. CN II-XII grossly intact 4+/5 bilateral IO and HG otherwise 5/5 BUE. Incision c/d/I with dermabond in place  Disposition: Discharge disposition: 01-Home or Self Care       Allergies as of 12/09/2021   No Known Allergies      Medication List     STOP taking these medications    ibuprofen 200 MG tablet Commonly known as: ADVIL       TAKE these medications    gabapentin 300 MG capsule Commonly known as: NEURONTIN Take 300 mg by mouth 3 (three) times daily.   lisinopril-hydrochlorothiazide 20-12.5 MG tablet Commonly known as: ZESTORETIC TAKE 1 TABLET BY MOUTH EVERY DAY   methocarbamol 500 MG tablet Commonly known as: Robaxin Take 1 tablet (500 mg total) by mouth 4 (four) times daily.   oxyCODONE 5 MG immediate release tablet Commonly known as: Roxicodone Take 1 tablet (5 mg total) by mouth every 6 (six) hours as needed for up to 5 days for severe pain.   senna 8.6 MG Tabs tablet Commonly known as: SENOKOT Take 1  tablet (8.6 mg total) by mouth daily as needed for mild constipation.        Follow-up Information     12/11/2021, PA Follow up in 2 week(s).   Why: For incision check. This appointment should already be scheduled with the [facility]. Please call the office with any questions or concerns regarding appointment date and time. Contact information: [address] [place] [phone]                 Signed: [phone] 12/09/2021, 2:55 PM

## 2021-12-09 NOTE — Anesthesia Procedure Notes (Addendum)
Procedure Name: Intubation Date/Time: 12/09/2021 12:27 PM Performed by: Hermenia Bers, CRNA Pre-anesthesia Checklist: Patient identified, Patient being monitored, Timeout performed, Emergency Drugs available and Suction available Patient Re-evaluated:Patient Re-evaluated prior to induction Oxygen Delivery Method: Circle system utilized Preoxygenation: Pre-oxygenation with 100% oxygen Induction Type: IV induction Ventilation: Mask ventilation without difficulty Laryngoscope Size: 4 and McGraph Grade View: Grade I Tube type: Oral Tube size: 7.5 mm Number of attempts: 1 Airway Equipment and Method: Stylet and Video-laryngoscopy Placement Confirmation: ETT inserted through vocal cords under direct vision, positive ETCO2 and breath sounds checked- equal and bilateral Secured at: 22 cm Tube secured with: Tape Dental Injury: Teeth and Oropharynx as per pre-operative assessment  Comments: Atraumatic laryngoscopy and intubation. Lips and teeth remain unchanged from preoperative assessment. Soft bite block placed between left molars and teeth

## 2021-12-09 NOTE — Transfer of Care (Signed)
Immediate Anesthesia Transfer of Care Note  Patient: COPELAND NEISEN  Procedure(s) Performed: C3-5 ANTERIOR CERVICAL DECOMPRESSION/DISCECTOMY FUSION 2 LEVELS  Patient Location: PACU  Anesthesia Type:General  Level of Consciousness: awake, alert  and oriented  Airway & Oxygen Therapy: Patient Spontanous Breathing and Patient connected to face mask oxygen  Post-op Assessment: Report given to RN and Post -op Vital signs reviewed and stable  Post vital signs: Reviewed and stable  Last Vitals:  Vitals Value Taken Time  BP 133/67 12/09/21 1418  Temp 36.2 C 12/09/21 1418  Pulse 83 12/09/21 1423  Resp 16 12/09/21 1423  SpO2 98 % 12/09/21 1423  Vitals shown include unvalidated device data.  Last Pain:  Vitals:   12/09/21 1418  TempSrc:   PainSc: 0-No pain         Complications: No notable events documented.

## 2021-12-09 NOTE — Anesthesia Postprocedure Evaluation (Signed)
Anesthesia Post Note  Patient: Eric Nixon  Procedure(s) Performed: C3-5 ANTERIOR CERVICAL DECOMPRESSION/DISCECTOMY FUSION 2 LEVELS  Patient location during evaluation: PACU Anesthesia Type: General Level of consciousness: awake and alert Pain management: pain level controlled Vital Signs Assessment: post-procedure vital signs reviewed and stable Respiratory status: spontaneous breathing, nonlabored ventilation and respiratory function stable Cardiovascular status: blood pressure returned to baseline and stable Postop Assessment: no apparent nausea or vomiting Anesthetic complications: no   No notable events documented.   Last Vitals:  Vitals:   12/09/21 1445 12/09/21 1450  BP: (!) 128/95   Pulse: 79 76  Resp: 14 11  Temp:    SpO2: 94% 92%    Last Pain:  Vitals:   12/09/21 1450  TempSrc:   PainSc: 8                  Karleen Hampshire

## 2021-12-09 NOTE — Op Note (Signed)
Indications: Eric Nixon is a 65 yo male who presented with cervical myelopathy and anterolisthesis.    Findings: cervical stenosis  Preoperative Diagnosis: Cervical myelopathy G95.9, Anterolisthesis M43.10 Postoperative Diagnosis: same   EBL: 25 ml IVF: 500 ml Drains: none Disposition: Extubated and Stable to PACU Complications: none  No foley catheter was placed.   Preoperative Note:   Risks of surgery discussed include: infection, bleeding, stroke, coma, death, paralysis, CSF leak, nerve/spinal cord injury, numbness, tingling, weakness, complex regional pain syndrome, recurrent stenosis and/or disc herniation, vascular injury, development of instability, neck/back pain, need for further surgery, persistent symptoms, development of deformity, and the risks of anesthesia. The patient understood these risks and agreed to proceed.  Operative Note:   Procedure:  1) Anterior cervical diskectomy and fusion at C3/4 and C4/5 2) Anterior cervical instrumentation at C3 - 5 using Globus Xtend 3) Placement of biomechanical devices at C3/4 and C4/5  4) Use of operative microscope 5) Use of flouroscopy   Procedure: After obtaining informed consent, the patient taken to the operating room, placed in supine position, general anesthesia induced.  The patient had a small shoulder roll placed behind their shoulders.  The patient received preop antibiotics and IV Decadron.  The patient had a neck incision outlined, was prepped and draped in usual sterile fashion. The incision was injected with local anesthetic.   An incision was opened, dissection taken down medial to the carotid artery and jugular vein, lateral to the trachea and esophagus.  The prevertebral fascia identified and a localizing x-ray demonstrated the correct level.  The longus colli were dissected laterally, and self-retaining retractors placed to open the operative field. The microscope was then brought into the field.  With this  complete, distractor pins were placed in the vertebral bodies of C3 and C5. The distractor was placed, and the annuli at C3/4 and C4/5 were opened using a bovie.  Curettes and pituitary rongeurs used to remove the majority of disk, then the drill was used to remove the posterior osteophyte and begin the foraminotomies. The nerve hook was used to elevate the posterior longitudinal ligament, which was then removed with Kerrison rongeurs. The microblunt nerve hook could be passed out the foramina bilaterally at each level.   Meticulous hemostasis was obtained.  A biomechanical device (Globus Hedron 7 mm height x 14 mm width by 12 mm depth) was placed at C3/4. A second biomechanical device (Globus Hedron 7 mm height x 14 mm width by 12 mm depth) was placed at C4/5. Each device had been filled with allograft for aid in arthrodesis.  The caspar distractor was removed, and bone wax used for hemostasis. A 32 mm Globus Xtend plate was chosen.  Two screws placed in each vertebral body, respectively making sure the screws were behind the locking mechanism.  Final AP and lateral radiographs were taken.   With everything in good position, the wound was irrigated copiously with bacitracin-containing solution and meticulous hemostasis obtained.  Wound was closed in 2 layers using interrupted inverted 3-0 Vicryl sutures in the platysma and 3-0 monocryl on the dermis.  The wound was dressed with dermabond, the head of bed at 30 degrees, taken to recovery room in stable condition.  No new postop neurological deficits were identified.  Sponge and pattie counts were correct at the end of the procedure.    I performed the entire procedure with the assistance of Manning Charity PA as an Designer, television/film set.  Venetia Night MD

## 2021-12-10 ENCOUNTER — Encounter: Payer: Self-pay | Admitting: Neurosurgery

## 2021-12-11 ENCOUNTER — Telehealth: Payer: Self-pay | Admitting: Neurosurgery

## 2021-12-11 NOTE — Telephone Encounter (Signed)
Was contacted by the patient via the answering service.  His wife had reached out because she was concerned about possibly oozing from the neck incision.  She describes using his clear-colored.  I asked that the patient be put on the phone so I could hear how he was doing.  He was immediately available and sounded alert and appropriate.  I did not hear any stridor or difficulty breathing he it was clear voiced and did not describe any shortness of breath.  He did however note that overnight he developed what he describes as a tennis ball size swelling in his neck around the site of his incision he himself looked in the mirror this morning and did not see any drainage from his incision.  He denied any difficulty breathing currently however the change in swelling around the neck incision overnight was concerning to him and that is what he had reached out.  I told the patient he should present to the emergency room to better evaluate the situation around his neck to make sure he is not having any difficulty breathing or other compressive symptoms around his trachea and that he would likely need a CT scan of his cervical spine to evaluate the surgical site and evaluate for any potential collections or hematomas in the area.  He otherwise denied any new pain he denied any extremity difficulties he feels comfortable being transported by his wife to the emergency room.  I told if he is unable to be transported to the emergency room by his wife for anything new or concerning happens that he should call 911 and have EMS transported to the emergency room.  They expressed her understanding of this and wished to proceed with that plan  Eric Nixon. Madaline Brilliant, MD Neurosurgery

## 2022-08-18 ENCOUNTER — Other Ambulatory Visit: Payer: Self-pay | Admitting: Family Medicine

## 2022-08-18 DIAGNOSIS — I10 Essential (primary) hypertension: Secondary | ICD-10-CM

## 2022-08-18 DIAGNOSIS — E538 Deficiency of other specified B group vitamins: Secondary | ICD-10-CM

## 2022-08-18 NOTE — Telephone Encounter (Signed)
Refill request for lisinopril-hydrochlorothiazide (ZESTORETIC) 20-12.5 MG tablet  LOV - 06/15/21 Next OV - 09/09/22 Last refill - 12/04/21 #90/1

## 2022-08-18 NOTE — Telephone Encounter (Signed)
Patient is overdue for CPE; please call to schedule appt. 

## 2022-08-18 NOTE — Telephone Encounter (Signed)
Patient scheduled.

## 2022-09-01 ENCOUNTER — Other Ambulatory Visit (INDEPENDENT_AMBULATORY_CARE_PROVIDER_SITE_OTHER): Payer: Medicare Other

## 2022-09-01 DIAGNOSIS — E538 Deficiency of other specified B group vitamins: Secondary | ICD-10-CM | POA: Diagnosis not present

## 2022-09-01 DIAGNOSIS — I10 Essential (primary) hypertension: Secondary | ICD-10-CM

## 2022-09-01 LAB — CBC WITH DIFFERENTIAL/PLATELET
Basophils Absolute: 0.1 10*3/uL (ref 0.0–0.1)
Basophils Relative: 0.8 % (ref 0.0–3.0)
Eosinophils Absolute: 0.2 10*3/uL (ref 0.0–0.7)
Eosinophils Relative: 2.7 % (ref 0.0–5.0)
HCT: 45.7 % (ref 39.0–52.0)
Hemoglobin: 14.9 g/dL (ref 13.0–17.0)
Lymphocytes Relative: 23.6 % (ref 12.0–46.0)
Lymphs Abs: 1.8 10*3/uL (ref 0.7–4.0)
MCHC: 32.7 g/dL (ref 30.0–36.0)
MCV: 86.4 fl (ref 78.0–100.0)
Monocytes Absolute: 0.6 10*3/uL (ref 0.1–1.0)
Monocytes Relative: 8.3 % (ref 3.0–12.0)
Neutro Abs: 4.9 10*3/uL (ref 1.4–7.7)
Neutrophils Relative %: 64.6 % (ref 43.0–77.0)
Platelets: 268 10*3/uL (ref 150.0–400.0)
RBC: 5.29 Mil/uL (ref 4.22–5.81)
RDW: 14.4 % (ref 11.5–15.5)
WBC: 7.7 10*3/uL (ref 4.0–10.5)

## 2022-09-01 LAB — COMPREHENSIVE METABOLIC PANEL
ALT: 31 U/L (ref 0–53)
AST: 23 U/L (ref 0–37)
Albumin: 4.2 g/dL (ref 3.5–5.2)
Alkaline Phosphatase: 95 U/L (ref 39–117)
BUN: 23 mg/dL (ref 6–23)
CO2: 25 mEq/L (ref 19–32)
Calcium: 9.5 mg/dL (ref 8.4–10.5)
Chloride: 100 mEq/L (ref 96–112)
Creatinine, Ser: 1.22 mg/dL (ref 0.40–1.50)
GFR: 61.54 mL/min (ref >60.00)
Glucose, Bld: 145 mg/dL — ABNORMAL HIGH (ref 70–99)
Potassium: 4.5 mEq/L (ref 3.5–5.1)
Sodium: 134 mEq/L — ABNORMAL LOW (ref 135–145)
Total Bilirubin: 0.4 mg/dL (ref 0.2–1.2)
Total Protein: 7 g/dL (ref 6.0–8.3)

## 2022-09-01 LAB — LIPID PANEL
Cholesterol: 153 mg/dL (ref 0–200)
HDL: 40.2 mg/dL (ref >39.00)
LDL Cholesterol: 95 mg/dL (ref 0–99)
NonHDL: 112.47
Total CHOL/HDL Ratio: 4
Triglycerides: 87 mg/dL (ref 0.0–149.0)
VLDL: 17.4 mg/dL (ref 0.0–40.0)

## 2022-09-01 LAB — VITAMIN B12: Vitamin B-12: 207 pg/mL — ABNORMAL LOW (ref 211–911)

## 2022-09-09 ENCOUNTER — Encounter: Payer: Self-pay | Admitting: Family Medicine

## 2022-09-09 ENCOUNTER — Ambulatory Visit (INDEPENDENT_AMBULATORY_CARE_PROVIDER_SITE_OTHER): Payer: Medicare Other | Admitting: Family Medicine

## 2022-09-09 VITALS — BP 118/82 | HR 85 | Temp 97.6°F | Ht 69.0 in | Wt 274.0 lb

## 2022-09-09 DIAGNOSIS — Z7189 Other specified counseling: Secondary | ICD-10-CM

## 2022-09-09 DIAGNOSIS — M48061 Spinal stenosis, lumbar region without neurogenic claudication: Secondary | ICD-10-CM | POA: Diagnosis not present

## 2022-09-09 DIAGNOSIS — Z23 Encounter for immunization: Secondary | ICD-10-CM

## 2022-09-09 DIAGNOSIS — E538 Deficiency of other specified B group vitamins: Secondary | ICD-10-CM

## 2022-09-09 DIAGNOSIS — M542 Cervicalgia: Secondary | ICD-10-CM

## 2022-09-09 DIAGNOSIS — R739 Hyperglycemia, unspecified: Secondary | ICD-10-CM

## 2022-09-09 DIAGNOSIS — Z0000 Encounter for general adult medical examination without abnormal findings: Secondary | ICD-10-CM

## 2022-09-09 DIAGNOSIS — I10 Essential (primary) hypertension: Secondary | ICD-10-CM

## 2022-09-09 MED ORDER — CYANOCOBALAMIN 1000 MCG/ML IJ SOLN
1000.0000 ug | Freq: Once | INTRAMUSCULAR | Status: AC
  Administered 2022-09-09: 1000 ug via INTRAMUSCULAR

## 2022-09-09 MED ORDER — CYANOCOBALAMIN 1000 MCG/ML IJ SOLN: INTRAMUSCULAR | 0 refills | Status: DC

## 2022-09-09 NOTE — Patient Instructions (Addendum)
You could try tapering gabapentin by 1 pill a week.  Weekly B12 x4 doses then monthly.   Recheck labs in about 3 months.  Fasting lab visit.   Take care.  Glad to see you. Check with your insurance to see if they will cover the shingles shot. Let me know if you want to get colon cancer screening.

## 2022-09-09 NOTE — Progress Notes (Unsigned)
I have personally reviewed the Medicare Annual Wellness questionnaire and have noted 1. The patient's medical and social history 2. Their use of alcohol, tobacco or illicit drugs 3. Their current medications and supplements 4. The patient's functional ability including ADL's, fall risks, home safety risks and hearing or visual             impairment. 5. Diet and physical activities 6. Evidence for depression or mood disorders  The patients weight, height, BMI have been recorded in the chart and visual acuity is per eye clinic.  I have made referrals, counseling and provided education to the patient based review of the above and I have provided the pt with a written personalized care plan for preventive services.  Provider list updated- see scanned forms.  Routine anticipatory guidance given to patient.  See health maintenance. The possibility exists that previously documented standard health maintenance information may have been brought forward from a previous encounter into this note.  If needed, that same information has been updated to reflect the current situation based on today's encounter.    Flu 2023 Shingles discussed with patient. PNA discussed with patient Tetanus 2016. COVID-vaccine previously done Colon cancer screening declined.  He will consider.  He is aware that he can decrease his risk of death from colon cancer with screening. Prostate cancer screening and PSA options (with potential risks and benefits of testing vs not testing) were discussed along with recent recs/guidelines.  He declined testing PSA at this point. Advance directive-daughter Jodie designated if patient were incapacitated. Cognitive function addressed- see scanned forms- and if abnormal then additional documentation follows.   In addition to [facility], follow up visit for the below conditions:  His wife has memory and mood changes, d/w pt (he is on his wife's DPR).  Discussed.    Hypertension:     Using medication without problems or lightheadedness: yes Chest pain with exertion:no Edema:no Short of breath:no Labs d/w pt.   B12 def d/w pt.    Joint pain.  Celebrex didn't help.  Walking with a cane.  Taking 400mg  ibuprofen, up to QID.  Nsaid cautions d/w pt.  He is working on neck ROM at home.   He has used gabapentin in the meantime.  ED noted,  unclear if from gabapentin. He still has numbness in the hands.  Still with sig lower back pain, esp with walking after prolonged sitting.  He is considering back surgery.    Hyperglycemia discussed with patient.  Discussed diet and exercise and recheck A1c with next labs.  PMH and SH reviewed  Meds, vitals, and allergies reviewed.   ROS: Per HPI.  Unless specifically indicated otherwise in HPI, the patient denies:  General: fever. Eyes: acute vision changes ENT: sore throat Cardiovascular: chest pain Respiratory: SOB GI: vomiting GU: dysuria Musculoskeletal: acute back pain Derm: acute rash Neuro: acute motor dysfunction Psych: worsening mood Endocrine: polydipsia Heme: bleeding Allergy: hayfever  GEN: nad, alert and oriented HEENT: ncat NECK: supple w/o LA CV: rrr. PULM: ctab, no inc wob ABD: soft, +bs EXT: no edema SKIN: no acute rash

## 2022-09-15 ENCOUNTER — Telehealth: Payer: Self-pay

## 2022-09-15 ENCOUNTER — Ambulatory Visit (INDEPENDENT_AMBULATORY_CARE_PROVIDER_SITE_OTHER): Payer: Medicare Other

## 2022-09-15 DIAGNOSIS — E538 Deficiency of other specified B group vitamins: Secondary | ICD-10-CM

## 2022-09-15 DIAGNOSIS — Z7189 Other specified counseling: Secondary | ICD-10-CM | POA: Insufficient documentation

## 2022-09-15 DIAGNOSIS — Z0000 Encounter for general adult medical examination without abnormal findings: Secondary | ICD-10-CM | POA: Insufficient documentation

## 2022-09-15 MED ORDER — CYANOCOBALAMIN 1000 MCG/ML IJ SOLN
1000.0000 ug | Freq: Once | INTRAMUSCULAR | Status: AC
  Administered 2022-09-15: 1000 ug via INTRAMUSCULAR

## 2022-09-15 NOTE — Telephone Encounter (Signed)
Patient was asking about getting taught on how to administer B12 so he can get this done at home. Advised patient I would send it to PCP to review. 2nd weekly B12 injection will be given today.

## 2022-09-15 NOTE — Telephone Encounter (Signed)
Okay with me.  Please get patient set up for teaching.  I can send prescription for syringe and B12 later on, when he is ready to do this on his own.  Thanks.

## 2022-09-15 NOTE — Assessment & Plan Note (Signed)
  Advance directive-daughter Jodie designated if patient were incapacitated.

## 2022-09-15 NOTE — Assessment & Plan Note (Signed)
He could try tapering gabapentin by 1 pill a week.  He is considering surgery.  I will defer to patient and the back clinic.

## 2022-09-15 NOTE — Assessment & Plan Note (Signed)
Flu 2023 Shingles discussed with patient. PNA discussed with patient Tetanus 2016. COVID-vaccine previously done Colon cancer screening declined.  He will consider.  He is aware that he can decrease his risk of death from colon cancer with screening. Prostate cancer screening and PSA options (with potential risks and benefits of testing vs not testing) were discussed along with recent recs/guidelines.  He declined testing PSA at this point. Advance directive-daughter Jodie designated if patient were incapacitated. Cognitive function addressed- see scanned forms- and if abnormal then additional documentation follows.

## 2022-09-15 NOTE — Progress Notes (Signed)
Per orders of Dr. Damita Dunnings, co signed in his absence by Ria Bush, injection of weekly B12 #2 given by Kris Mouton. Patient tolerated injection well.

## 2022-09-15 NOTE — Assessment & Plan Note (Signed)
Weekly B12 x4 doses then monthly.   Recheck labs in about 3 months.  Fasting lab visit.

## 2022-09-15 NOTE — Assessment & Plan Note (Signed)
Celebrex didn't help.  Walking with a cane.  Taking 400mg  ibuprofen, up to QID.  Nsaid cautions d/w pt.  He is working on neck ROM at home.

## 2022-09-15 NOTE — Assessment & Plan Note (Signed)
Discussed diet and exercise and he can recheck A1c with next set of labs.

## 2022-09-15 NOTE — Assessment & Plan Note (Signed)
Continue lisinopril hydrochlorothiazide. ?

## 2022-09-16 ENCOUNTER — Ambulatory Visit: Payer: Medicare Other

## 2022-09-17 NOTE — Telephone Encounter (Signed)
Patient has been scheduled for B12 teaching and his 2nd injection on 09/22/22

## 2022-09-22 ENCOUNTER — Ambulatory Visit (INDEPENDENT_AMBULATORY_CARE_PROVIDER_SITE_OTHER): Payer: Medicare Other | Admitting: *Deleted

## 2022-09-22 DIAGNOSIS — E538 Deficiency of other specified B group vitamins: Secondary | ICD-10-CM

## 2022-09-22 MED ORDER — CYANOCOBALAMIN 1000 MCG/ML IJ SOLN
1000.0000 ug | Freq: Once | INTRAMUSCULAR | Status: AC
  Administered 2022-09-22: 1000 ug via INTRAMUSCULAR

## 2022-09-22 NOTE — Progress Notes (Signed)
Per orders of Dr. Gutierrez, injection of Vitamin B-12 given by Iain Sawchuk. Patient tolerated injection well. 

## 2022-09-29 ENCOUNTER — Ambulatory Visit (INDEPENDENT_AMBULATORY_CARE_PROVIDER_SITE_OTHER): Payer: Medicare Other | Admitting: *Deleted

## 2022-09-29 DIAGNOSIS — E538 Deficiency of other specified B group vitamins: Secondary | ICD-10-CM

## 2022-09-29 MED ORDER — CYANOCOBALAMIN 1000 MCG/ML IJ SOLN
1000.0000 ug | Freq: Once | INTRAMUSCULAR | Status: DC
  Administered 2022-09-29: 1000 ug via INTRAMUSCULAR

## 2022-09-29 NOTE — Progress Notes (Signed)
Per orders of Dr. Gutierrez, injection of Vitamin B-12 given by Keaston Pile. Patient tolerated injection well. 

## 2022-11-02 ENCOUNTER — Ambulatory Visit (INDEPENDENT_AMBULATORY_CARE_PROVIDER_SITE_OTHER): Payer: Medicare Other

## 2022-11-02 DIAGNOSIS — E538 Deficiency of other specified B group vitamins: Secondary | ICD-10-CM | POA: Diagnosis not present

## 2022-11-02 MED ORDER — CYANOCOBALAMIN 1000 MCG/ML IJ SOLN
1000.0000 ug | Freq: Once | INTRAMUSCULAR | Status: AC
  Administered 2022-11-02: 1000 ug via INTRAMUSCULAR

## 2022-11-02 NOTE — Progress Notes (Signed)
Per orders of Dr. Graham Duncan, injection of Vitamin B 12 in right deltoid given by Shirley Bolle Younts. Patient tolerated injection well.  

## 2022-12-07 ENCOUNTER — Ambulatory Visit (INDEPENDENT_AMBULATORY_CARE_PROVIDER_SITE_OTHER): Payer: Medicare Other | Admitting: *Deleted

## 2022-12-07 DIAGNOSIS — E538 Deficiency of other specified B group vitamins: Secondary | ICD-10-CM

## 2022-12-07 MED ORDER — CYANOCOBALAMIN 1000 MCG/ML IJ SOLN
1000.0000 ug | Freq: Once | INTRAMUSCULAR | Status: AC
  Administered 2022-12-07: 1000 ug via INTRAMUSCULAR

## 2022-12-07 NOTE — Progress Notes (Signed)
Per orders of Dr. Duncan, injection of Vitamin B 12 given by Nelton Amsden. Patient tolerated injection well.  

## 2022-12-13 ENCOUNTER — Other Ambulatory Visit (INDEPENDENT_AMBULATORY_CARE_PROVIDER_SITE_OTHER): Payer: Medicare Other

## 2022-12-13 DIAGNOSIS — E538 Deficiency of other specified B group vitamins: Secondary | ICD-10-CM

## 2022-12-13 DIAGNOSIS — R739 Hyperglycemia, unspecified: Secondary | ICD-10-CM | POA: Diagnosis not present

## 2022-12-13 LAB — BASIC METABOLIC PANEL WITH GFR
BUN: 22 mg/dL (ref 6–23)
CO2: 25 meq/L (ref 19–32)
Calcium: 9.8 mg/dL (ref 8.4–10.5)
Chloride: 101 meq/L (ref 96–112)
Creatinine, Ser: 1.14 mg/dL (ref 0.40–1.50)
GFR: 66.63 mL/min
Glucose, Bld: 148 mg/dL — ABNORMAL HIGH (ref 70–99)
Potassium: 4.8 meq/L (ref 3.5–5.1)
Sodium: 135 meq/L (ref 135–145)

## 2022-12-13 LAB — HEMOGLOBIN A1C: Hgb A1c MFr Bld: 6.9 % — ABNORMAL HIGH (ref 4.6–6.5)

## 2022-12-13 LAB — VITAMIN B12: Vitamin B-12: 529 pg/mL (ref 211–911)

## 2022-12-15 ENCOUNTER — Other Ambulatory Visit: Payer: Medicare Other

## 2022-12-15 ENCOUNTER — Encounter: Payer: Self-pay | Admitting: Family Medicine

## 2022-12-15 DIAGNOSIS — E119 Type 2 diabetes mellitus without complications: Secondary | ICD-10-CM | POA: Insufficient documentation

## 2022-12-15 DIAGNOSIS — E1129 Type 2 diabetes mellitus with other diabetic kidney complication: Secondary | ICD-10-CM | POA: Insufficient documentation

## 2023-01-11 ENCOUNTER — Ambulatory Visit: Payer: Medicare Other

## 2023-01-18 ENCOUNTER — Ambulatory Visit (INDEPENDENT_AMBULATORY_CARE_PROVIDER_SITE_OTHER): Payer: Medicare Other | Admitting: *Deleted

## 2023-01-18 DIAGNOSIS — E538 Deficiency of other specified B group vitamins: Secondary | ICD-10-CM

## 2023-01-18 MED ORDER — CYANOCOBALAMIN 1000 MCG/ML IJ SOLN
1000.0000 ug | Freq: Once | INTRAMUSCULAR | Status: AC
  Administered 2023-01-18: 1000 ug via INTRAMUSCULAR

## 2023-01-18 NOTE — Progress Notes (Signed)
Per orders of Dr. Gutierrez, injection of Vitamin B-12 given by Solita Macadam. Patient tolerated injection well. 

## 2023-02-23 ENCOUNTER — Ambulatory Visit (INDEPENDENT_AMBULATORY_CARE_PROVIDER_SITE_OTHER): Payer: Medicare Other | Admitting: *Deleted

## 2023-02-23 DIAGNOSIS — E538 Deficiency of other specified B group vitamins: Secondary | ICD-10-CM | POA: Diagnosis not present

## 2023-02-23 MED ORDER — CYANOCOBALAMIN 1000 MCG/ML IJ SOLN
1000.0000 ug | Freq: Once | INTRAMUSCULAR | Status: AC
  Administered 2023-02-23: 1000 ug via INTRAMUSCULAR

## 2023-02-23 NOTE — Progress Notes (Signed)
Per orders of Dr. Gutierrez, injection of Vitamin B-12 given by Daniell Paradise. Patient tolerated injection well. 

## 2023-02-24 ENCOUNTER — Other Ambulatory Visit: Payer: Self-pay | Admitting: Family Medicine

## 2023-03-30 ENCOUNTER — Ambulatory Visit (INDEPENDENT_AMBULATORY_CARE_PROVIDER_SITE_OTHER): Payer: Medicare Other

## 2023-03-30 DIAGNOSIS — E538 Deficiency of other specified B group vitamins: Secondary | ICD-10-CM | POA: Diagnosis not present

## 2023-03-30 MED ORDER — CYANOCOBALAMIN 1000 MCG/ML IJ SOLN
1000.0000 ug | Freq: Once | INTRAMUSCULAR | Status: AC
  Administered 2023-03-30: 1000 ug via INTRAMUSCULAR

## 2023-03-30 NOTE — Progress Notes (Signed)
After obtaining consent, and per orders of Dr. Danise Mina, injection of B-12 in right deltoid given by Ferne Reus. Patient tolerated injection well.

## 2023-05-03 ENCOUNTER — Ambulatory Visit (INDEPENDENT_AMBULATORY_CARE_PROVIDER_SITE_OTHER): Payer: Medicare Other

## 2023-05-03 ENCOUNTER — Telehealth: Payer: Self-pay

## 2023-05-03 DIAGNOSIS — E538 Deficiency of other specified B group vitamins: Secondary | ICD-10-CM | POA: Diagnosis not present

## 2023-05-03 MED ORDER — CYANOCOBALAMIN 1000 MCG/ML IJ SOLN
1000.0000 ug | Freq: Once | INTRAMUSCULAR | Status: AC
  Administered 2023-05-03: 1000 ug via INTRAMUSCULAR

## 2023-05-03 NOTE — Telephone Encounter (Signed)
Dr. Para March request patient make DM f/u with A1c check. Please call patient to schedule appt.

## 2023-05-03 NOTE — Progress Notes (Signed)
Per orders of Dr. Crawford Givens, injection of Vitamin B 12 injection in left deltoid given by Lewanda Rife. Patient tolerated injection well. Pt receives monthly injections of B 12.

## 2023-05-03 NOTE — Telephone Encounter (Signed)
Patient has been scheduled

## 2023-05-09 ENCOUNTER — Encounter: Payer: Self-pay | Admitting: Family Medicine

## 2023-05-09 ENCOUNTER — Ambulatory Visit (INDEPENDENT_AMBULATORY_CARE_PROVIDER_SITE_OTHER): Payer: Medicare Other | Admitting: Family Medicine

## 2023-05-09 VITALS — BP 116/80 | HR 86 | Temp 97.3°F | Ht 69.0 in | Wt 273.0 lb

## 2023-05-09 DIAGNOSIS — M2550 Pain in unspecified joint: Secondary | ICD-10-CM

## 2023-05-09 DIAGNOSIS — M25559 Pain in unspecified hip: Secondary | ICD-10-CM | POA: Diagnosis not present

## 2023-05-09 DIAGNOSIS — E119 Type 2 diabetes mellitus without complications: Secondary | ICD-10-CM | POA: Diagnosis not present

## 2023-05-09 DIAGNOSIS — M25569 Pain in unspecified knee: Secondary | ICD-10-CM

## 2023-05-09 DIAGNOSIS — R739 Hyperglycemia, unspecified: Secondary | ICD-10-CM | POA: Diagnosis not present

## 2023-05-09 LAB — POCT GLYCOSYLATED HEMOGLOBIN (HGB A1C): Hemoglobin A1C: 6.4 % — AB (ref 4.0–5.6)

## 2023-05-09 MED ORDER — CYANOCOBALAMIN 1000 MCG/ML IJ SOLN: INTRAMUSCULAR | 0 refills | Status: AC

## 2023-05-09 MED ORDER — GABAPENTIN 300 MG PO CAPS: ORAL_CAPSULE | ORAL | 1 refills | Status: DC

## 2023-05-09 NOTE — Progress Notes (Signed)
Diabetes:  No meds.  A1c improved.   Hypoglycemic episodes: no Hyperglycemic episodes: no Feet problems: no paresthesias in the feet.   Blood Sugars averaging: not checked.   eye exam within last year: d/w pt about follow up, when possible.   A1 6.4, d/w pt at OV.  D/w pt about diet and exercise.  He cut back on some carbs.   Walking with cane at baseline.  He is dealing with hip and knee pain.   He still has numbness in the R 1st-3rd fingers.  He is off gabapentin in the meantime. He is taking on average 8 ibuprofen a day.  Gabapentin helped with pain in the past.    Home stressors d/w pt, wife with memory loss.    D/w pt about parking sticker.  He is considering neurosurgery f/u, depending on his back pain. Form done for patient.   Meds, vitals, and allergies reviewed.  ROS: Per HPI unless specifically indicated in ROS section   GEN: nad, alert and oriented HEENT: ncat NECK: supple w/o LA CV: rrr. PULM: ctab, no inc wob ABD: soft, +bs EXT: no edema SKIN: well perfused.  Walking with limp/cane at baseline.  Altered sensation distal 1-3 R fingers at baseline.   Diabetic foot exam: Normal inspection No skin breakdown No calluses  Normal DP pulses Normal sensation to light touch and monofilament Nails normal  30 minutes were devoted to patient care in this encounter (this includes time spent reviewing the patient's file/history, interviewing and examining the patient, counseling/reviewing plan with patient).

## 2023-05-09 NOTE — Assessment & Plan Note (Signed)
D/w pt about parking sticker.  He is considering neurosurgery f/u, depending on his back pain. Form done for patient.   Can restart gabapentin with routine cautions and try to taper dose of ibuprofen.

## 2023-05-09 NOTE — Assessment & Plan Note (Signed)
H/o, now with A1c <6.5.  no change in meds for sugar at this point.  Continue work on diet.  A1c d/w pt. Recheck ~fall 2024.

## 2023-05-09 NOTE — Patient Instructions (Addendum)
I would restart gabapentin and try to taper down ibuprofen.   Recheck at a yearly visit in October, labs ahead of time.  Take care.  Glad to see you.

## 2023-06-02 ENCOUNTER — Ambulatory Visit: Payer: Medicare Other

## 2023-06-07 ENCOUNTER — Ambulatory Visit (INDEPENDENT_AMBULATORY_CARE_PROVIDER_SITE_OTHER): Payer: Medicare Other | Admitting: *Deleted

## 2023-06-07 DIAGNOSIS — E538 Deficiency of other specified B group vitamins: Secondary | ICD-10-CM | POA: Diagnosis not present

## 2023-06-07 MED ORDER — CYANOCOBALAMIN 1000 MCG/ML IJ SOLN
1000.0000 ug | Freq: Once | INTRAMUSCULAR | Status: AC
  Administered 2023-06-07: 1000 ug via INTRAMUSCULAR

## 2023-06-07 NOTE — Progress Notes (Signed)
Per orders of Dr. Duncan, injection of Vitamin B-12 given by Dilyn Osoria. Patient tolerated injection well. 

## 2023-07-13 ENCOUNTER — Ambulatory Visit: Payer: Medicare Other

## 2023-07-13 DIAGNOSIS — E538 Deficiency of other specified B group vitamins: Secondary | ICD-10-CM

## 2023-07-13 MED ORDER — CYANOCOBALAMIN 1000 MCG/ML IJ SOLN
1000.0000 ug | Freq: Once | INTRAMUSCULAR | Status: AC
  Administered 2023-07-13: 1000 ug via INTRAMUSCULAR

## 2023-07-13 NOTE — Progress Notes (Signed)
 Patient presented for B 12 injection given by Jessica Isley, CMA to left deltoid, patient voiced no concerns nor showed any signs of distress during injection.  

## 2023-07-27 ENCOUNTER — Encounter (INDEPENDENT_AMBULATORY_CARE_PROVIDER_SITE_OTHER): Payer: Self-pay

## 2023-08-16 ENCOUNTER — Ambulatory Visit: Payer: Medicare Other

## 2023-08-16 DIAGNOSIS — E538 Deficiency of other specified B group vitamins: Secondary | ICD-10-CM | POA: Diagnosis not present

## 2023-08-16 MED ORDER — CYANOCOBALAMIN 1000 MCG/ML IJ SOLN
1000.0000 ug | Freq: Once | INTRAMUSCULAR | Status: AC
  Administered 2023-08-16: 1000 ug via INTRAMUSCULAR

## 2023-08-16 NOTE — Progress Notes (Addendum)
Per orders of Dr. Graham Duncan, injection of vitamin b 12 given by Rena Isley in right deltoid. Patient tolerated injection well. Patient will make appointment for 1 month.   

## 2023-09-05 NOTE — Progress Notes (Signed)
This encounter was created in error - please disregard.

## 2023-09-06 ENCOUNTER — Other Ambulatory Visit: Payer: Self-pay | Admitting: Family Medicine

## 2023-09-15 ENCOUNTER — Ambulatory Visit (INDEPENDENT_AMBULATORY_CARE_PROVIDER_SITE_OTHER): Payer: Medicare Other

## 2023-09-15 VITALS — Ht 69.0 in | Wt 270.0 lb

## 2023-09-15 DIAGNOSIS — Z0000 Encounter for general adult medical examination without abnormal findings: Secondary | ICD-10-CM

## 2023-09-15 NOTE — Progress Notes (Signed)
Because this visit was a virtual/telehealth visit,  certain criteria was not obtained, such a blood pressure, CBG if applicable, and timed get up and go. Any medications not marked as "taking" were not mentioned during the medication reconciliation part of the visit. Any vitals not documented were not able to be obtained due to this being a telehealth visit or patient was unable to self-report a recent blood pressure reading due to a lack of equipment at home via telehealth. Vitals that have been documented are verbally provided by the patient.   Subjective:   Eric Nixon is a 67 y.o. male who presents for Medicare Annual/Subsequent preventive examination.  Visit Complete: Virtual  I connected with  Eric Nixon on 09/15/23 by a audio enabled telemedicine application and verified that I am speaking with the correct person using two identifiers.  Patient Location: Home  Provider Location: Home Office  I discussed the limitations of evaluation and management by telemedicine. The patient expressed understanding and agreed to proceed.  Patient Medicare AWV questionnaire was completed by the patient on n/a; I have confirmed that all information answered by patient is correct and no changes since this date.  Cardiac Risk Factors include: advanced age (>20men, >65 women);dyslipidemia;hypertension;sedentary lifestyle;obesity (BMI >30kg/m2);male gender     Objective:    Today's Vitals   09/15/23 1409 09/15/23 1410  Weight: 270 lb (122.5 kg)   Height: 5\' 9"  (1.753 m)   PainSc:  8    Body mass index is 39.87 kg/m.     09/15/2023    2:12 PM 11/26/2021   11:49 AM  Advanced Directives  Does Patient Have a Medical Advance Directive? Yes Yes  Type of Estate agent of [place];Living will   Does patient want to make changes to medical advance directive? No - Patient declined   Copy of Healthcare Power of Attorney in Chart? Yes - validated most recent copy scanned in chart  (See row information)     Current Medications (verified) Outpatient Encounter Medications as of 09/15/2023  Medication Sig   cyanocobalamin (VITAMIN B12) 1000 MCG/ML injection IM monthly   gabapentin (NEURONTIN) 300 MG capsule Start with 1 tab a day and gradually increase to 1 tab 3 times a day if needed/tolerated.   ibuprofen (ADVIL) 200 MG tablet Take 400 mg by mouth every 6 (six) hours as needed.   lisinopril-hydrochlorothiazide (ZESTORETIC) 20-12.5 MG tablet TAKE 1 TABLET BY MOUTH EVERY DAY   No facility-administered encounter medications on file as of 09/15/2023.    Allergies (verified) Nsaids   History: Past Medical History:  Diagnosis Date   B12 deficiency    Cervical spine disease    ED (erectile dysfunction)    Headache    History of kidney stones    Hyperglycemia    Hypertension    Osteoarthritis of both hands    Peripheral neuropathy    Spinal stenosis of lumbar region    Past Surgical History:  Procedure Laterality Date   ANKLE SURGERY  08/2008   fx   ANTERIOR CERVICAL DECOMP/DISCECTOMY FUSION N/A 12/09/2021   Procedure: C3-5 ANTERIOR CERVICAL DECOMPRESSION/DISCECTOMY FUSION 2 LEVELS;  Surgeon: Venetia Night, MD;  Location: [facility] ORS;  Service: Neurosurgery;  Laterality: N/A;   CARPAL TUNNEL RELEASE Bilateral 2017   Family History  Problem Relation Age of Onset   Cancer Mother        "cancer behind her eye."   Bladder Cancer Father    Colon cancer Neg Hx  Prostate cancer Neg Hx    Social History   Socioeconomic History   Marital status: Married    Spouse name: Not on file   Number of children: 2   Years of education: HS   Highest education level: Not on file  Occupational History   Occupation: Owns Materials engineer   Occupation: Owns apartment   Occupation: Runs auction house  Tobacco Use   Smoking status: Never   Smokeless tobacco: Never  Substance and Sexual Activity   Alcohol use: Not Currently   Drug use: No   Sexual  activity: Yes    Partners: Female  Other Topics Concern   Not on file  Social History Narrative   Married   Lives at home with his wife.   Right-handed.   1 cup caffeine per day.   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    Auctioneer   Social Determinants of Health   Financial Resource Strain: Low Risk  (09/15/2023)   Overall Financial Resource Strain (CARDIA)    Difficulty of Paying Living Expenses: Not hard at all  Food Insecurity: No Food Insecurity (09/15/2023)   Hunger Vital Sign    Worried About Running Out of Food in the Last Year: Never true    Ran Out of Food in the Last Year: Never true  Transportation Needs: No Transportation Needs (09/15/2023)   PRAPARE - Administrator, Civil Service (Medical): No    Lack of Transportation (Non-Medical): No  Physical Activity: Patient Declined (09/15/2023)   Exercise Vital Sign    Days of Exercise per Week: Patient declined    Minutes of Exercise per Session: Patient declined  Stress: No Stress Concern Present (09/15/2023)   [employer] of Occupational Health - Occupational Stress Questionnaire    Feeling of Stress : Not at all  Social Connections: Socially Integrated (09/15/2023)   Social Connection and Isolation Panel [NHANES]    Frequency of Communication with Friends and Family: More than three times a week    Frequency of Social Gatherings with Friends and Family: More than three times a week    Attends Religious Services: More than 4 times per year    Active Member of [employer] or Organizations: Yes    Attends Engineer, structural: More than 4 times per year    Marital Status: Married    Tobacco Counseling Counseling given: Yes   Clinical Intake:  Pre-visit preparation completed: Yes  Pain : 0-10 Pain Score: 8  Pain Type: Chronic pain Pain Location: Other (Comment) (pt states that any joint you can have, it is painful for  him) Pain Orientation: Other (Comment)  (pt states all joints) Pain Descriptors / Indicators: Constant, Aching Pain Onset: More than a month ago Pain Frequency: Constant     BMI - recorded: 39.87 Nutritional Status: BMI > 30  Obese Nutritional Risks: None Diabetes: No  How often do you need to have someone help you when you read instructions, pamphlets, or other written materials from your doctor or pharmacy?: 1 - Never  Interpreter Needed?: No  Information entered by :: A Darnetta Kesselman, CMA   Activities of Daily Living    09/15/2023    2:11 PM  In your present state of health, do you have any difficulty performing the following activities:  Hearing? 0  Vision? 0  Difficulty concentrating or making decisions? 0  Walking or climbing stairs? 0  Dressing or bathing? 0  Doing errands, shopping? 0  Preparing Food and eating ? N  Using the Toilet? N  In the past six months, have you accidently leaked urine? N  Do you have problems with loss of bowel control? N  Managing your Medications? N  Managing your Finances? N  Housekeeping or managing your Housekeeping? N    Patient Care Team: Joaquim Nam, MD as PCP - General (Family Medicine)  Indicate any recent Medical Services you may have received from other than [facility] providers in the past year (date may be approximate).     Assessment:   This is a routine wellness examination for Eric Nixon.  Hearing/Vision screen Hearing Screening - Comments:: Patient denies any hearing difficulties.   Vision Screening - Comments:: Wears rx glasses - up to date with routine eye exams     Goals Addressed             This Visit's Progress    Patient Stated       To remain active        Depression Screen    09/15/2023    2:13 PM 05/09/2023    8:34 AM 09/09/2022    9:35 AM 06/15/2021   11:38 AM 10/13/2018    3:21 PM  PHQ 2/9 Scores  PHQ - 2 Score 0 0 0 0 0  PHQ- 9 Score  0       Fall Risk    09/15/2023    2:12 PM 05/09/2023    8:34 AM 09/09/2022    9:35 AM  Fall  Risk   Falls in the past year? 0 0 0  Number falls in past yr: 0 0 0  Injury with Fall? 0 0 0  Risk for fall due to : No Fall Risks No Fall Risks No Fall Risks  Follow up Falls prevention discussed Falls evaluation completed Falls evaluation completed    MEDICARE RISK AT HOME: Medicare Risk at Home Any stairs in or around the home?: No If so, are there any without handrails?: No Home free of loose throw rugs in walkways, pet beds, electrical cords, etc?: Yes Adequate lighting in your home to reduce risk of falls?: Yes Life alert?: No Use of a cane, walker or w/c?: No Grab bars in the bathroom?: Yes Shower chair or bench in shower?: Yes Elevated toilet seat or a handicapped toilet?: Yes  TIMED UP AND GO:  Was the test performed?  No    Cognitive Function:        09/15/2023    2:13 PM  6CIT Screen  What Year? 0 points  What month? 0 points  What time? 0 points  Count back from 20 0 points  Months in reverse 0 points  Repeat phrase 0 points  Total Score 0 points    Immunizations Immunization History  Administered Date(s) Administered   Fluad Quad(high Dose 65+) 09/09/2022   Influenza,inj,Quad PF,6+ Mos 10/13/2018   Janssen (J&J) SARS-COV-2 Vaccination 04/30/2020   Tdap 02/25/2015    TDAP status: Up to date  Flu Vaccine status: Due, Education has been provided regarding the importance of this vaccine. Advised may receive this vaccine at local pharmacy or Health Dept. Aware to provide a copy of the vaccination record if obtained from local pharmacy or Health Dept. Verbalized acceptance and understanding.  Pneumococcal vaccine status: Due, Education has been provided regarding the importance of this vaccine. Advised may receive this vaccine at local pharmacy or Health Dept. Aware to provide a copy of the vaccination record if obtained from local  pharmacy or Health Dept. Verbalized acceptance and understanding.  Covid-19 vaccine status: Information provided on how to  obtain vaccines.   Qualifies for Shingles Vaccine? Yes   Zostavax completed No   Shingrix Completed?: No.    Education has been provided regarding the importance of this vaccine. Patient has been advised to call insurance company to determine out of pocket expense if they have not yet received this vaccine. Advised may also receive vaccine at local pharmacy or Health Dept. Verbalized acceptance and understanding.  Screening Tests Health Maintenance  Topic Date Due   OPHTHALMOLOGY EXAM  Never done   Diabetic kidney evaluation - Urine ACR  Never done   Colonoscopy  Never done   Zoster Vaccines- Shingrix (1 of 2) Never done   Pneumonia Vaccine 86+ Years old (1 of 1 - PCV) Never done   INFLUENZA VACCINE  07/28/2023   Medicare Annual Wellness (AWV)  09/16/2023   HEMOGLOBIN A1C  11/09/2023   Diabetic kidney evaluation - eGFR measurement  12/14/2023   FOOT EXAM  05/08/2024   DTaP/Tdap/Td (2 - Td or Tdap) 02/24/2025   Hepatitis C Screening  Completed   HPV VACCINES  Aged Out   COVID-19 Vaccine  Discontinued    Health Maintenance  Health Maintenance Due  Topic Date Due   OPHTHALMOLOGY EXAM  Never done   Diabetic kidney evaluation - Urine ACR  Never done   Colonoscopy  Never done   Zoster Vaccines- Shingrix (1 of 2) Never done   Pneumonia Vaccine 5+ Years old (1 of 1 - PCV) Never done   INFLUENZA VACCINE  07/28/2023   Medicare Annual Wellness (AWV)  09/16/2023    Colorectal Cancer Screening: Patient declined colorectal cancer screening   Lung Cancer Screening: (Low Dose CT Chest recommended if Age 60-80 years, 20 pack-year currently smoking OR have quit w/in 15years.) does not qualify.   Additional Screening:  Hepatitis C Screening: does not qualify; Completed 07/04/2014  Vision Screening: Recommended annual ophthalmology exams for early detection of glaucoma and other disorders of the eye. Is the patient up to date with their annual eye exam?  Yes  Who is the provider or what  is the name of the office in which the patient attends annual eye exams? [facility] If pt is not established with a provider, would they like to be referred to a provider to establish care? No .   Dental Screening: Recommended annual dental exams for proper oral hygiene  Diabetic Foot Exam: Diabetic Foot Exam: Completed 05/09/2023  Community Resource Referral / Chronic Care Management: CRR required this visit?  No   CCM required this visit?  No     Plan:     I have personally reviewed and noted the following in the patient's chart:   Medical and social history Use of alcohol, tobacco or illicit drugs  Current medications and supplements including opioid prescriptions. Patient is not currently taking opioid prescriptions. Functional ability and status Nutritional status Physical activity Advanced directives List of other physicians Hospitalizations, surgeries, and ER visits in previous 12 months Vitals Screenings to include cognitive, depression, and falls Referrals and appointments  In addition, I have reviewed and discussed with patient certain preventive protocols, quality metrics, and best practice recommendations. A written personalized care plan for preventive services as well as general preventive health recommendations were provided to patient.     Jordan Hawks Shiloh Swopes, CMA   09/15/2023   After Visit Summary: (MyChart) Due to this being a telephonic visit, the  after visit summary with patients personalized plan was offered to patient via MyChart   Nurse Notes:

## 2023-09-15 NOTE — Patient Instructions (Signed)
Eric Nixon , Thank you for taking time to come for your Medicare Wellness Visit. I appreciate your ongoing commitment to your health goals. Please review the following plan we discussed and let me know if I can assist you in the future.   Referrals/Orders/Follow-Ups/Clinician Recommendations:  Follow up: 1 year September 20, 2024 at 1:40pm virtual visit.   This is a list of the screening recommended for you and due dates:  Health Maintenance  Topic Date Due   Eye exam for diabetics  Never done   Yearly kidney health urinalysis for diabetes  Never done   Colon Cancer Screening  Never done   Zoster (Shingles) Vaccine (1 of 2) Never done   Pneumonia Vaccine (1 of 1 - PCV) Never done   Flu Shot  07/28/2023   Hemoglobin A1C  11/09/2023   Yearly kidney function blood test for diabetes  12/14/2023   Complete foot exam   05/08/2024   Medicare Annual Wellness Visit  09/14/2024   DTaP/Tdap/Td vaccine (2 - Td or Tdap) 02/24/2025   Hepatitis C Screening  Completed   HPV Vaccine  Aged Out   COVID-19 Vaccine  Discontinued    Advanced directives: (In Chart) A copy of your advanced directives are scanned into your chart should your provider ever need it.  Next Medicare Annual Wellness Visit scheduled for next year: Yes  Preventive Care 68 Years and Older, Male Preventive care refers to lifestyle choices and visits with your health care provider that can promote health and wellness. Preventive care visits are also called wellness exams. What can I expect for my preventive care visit? Counseling During your preventive care visit, your health care provider may ask about your: Medical history, including: Past medical problems. Family medical history. History of falls. Current health, including: Emotional well-being. Home life and relationship well-being. Sexual activity. Memory and ability to understand (cognition). Lifestyle, including: Alcohol, nicotine or tobacco, and drug use. Access to  firearms. Diet, exercise, and sleep habits. Work and work Astronomer. Sunscreen use. Safety issues such as seatbelt and bike helmet use. Physical exam Your health care provider will check your: Height and weight. These may be used to calculate your BMI (body mass index). BMI is a measurement that tells if you are at a healthy weight. Waist circumference. This measures the distance around your waistline. This measurement also tells if you are at a healthy weight and may help predict your risk of certain diseases, such as type 2 diabetes and high blood pressure. Heart rate and blood pressure. Body temperature. Skin for abnormal spots. What immunizations do I need?  Vaccines are usually given at various ages, according to a schedule. Your health care provider will recommend vaccines for you based on your age, medical history, and lifestyle or other factors, such as travel or where you work. What tests do I need? Screening Your health care provider may recommend screening tests for certain conditions. This may include: Lipid and cholesterol levels. Diabetes screening. This is done by checking your blood sugar (glucose) after you have not eaten for a while (fasting). Hepatitis C test. Hepatitis B test. HIV (human immunodeficiency virus) test. STI (sexually transmitted infection) testing, if you are at risk. Lung cancer screening. Colorectal cancer screening. Prostate cancer screening. Abdominal aortic aneurysm (AAA) screening. You may need this if you are a current or former smoker. Talk with your health care provider about your test results, treatment options, and if necessary, the need for more tests. Follow these instructions at home:  Eating and drinking  Eat a diet that includes fresh fruits and vegetables, whole grains, lean protein, and low-fat dairy products. Limit your intake of foods with high amounts of sugar, saturated fats, and salt. Take vitamin and mineral supplements as  recommended by your health care provider. Do not drink alcohol if your health care provider tells you not to drink. If you drink alcohol: Limit how much you have to 0-2 drinks a day. Know how much alcohol is in your drink. In the [place] one drink equals one 12 oz bottle of beer (355 mL), one 5 oz glass of wine (148 mL), or one 1 oz glass of hard liquor (44 mL). Lifestyle Brush your teeth every morning and night with fluoride toothpaste. Floss one time each day. Exercise for at least 30 minutes 5 or more days each week. Do not use any products that contain nicotine or tobacco. These products include cigarettes, chewing tobacco, and vaping devices, such as e-cigarettes. If you need help quitting, ask your health care provider. Do not use drugs. If you are sexually active, practice safe sex. Use a condom or other form of protection to prevent STIs. Take aspirin only as told by your health care provider. Make sure that you understand how much to take and what form to take. Work with your health care provider to find out whether it is safe and beneficial for you to take aspirin daily. Ask your health care provider if you need to take a cholesterol-lowering medicine (statin). Find healthy ways to manage stress, such as: Meditation, yoga, or listening to music. Journaling. Talking to a trusted person. Spending time with friends and family. Safety Always wear your seat belt while driving or riding in a vehicle. Do not drive: If you have been drinking alcohol. Do not ride with someone who has been drinking. When you are tired or distracted. While texting. If you have been using any mind-altering substances or drugs. Wear a helmet and other protective equipment during sports activities. If you have firearms in your house, make sure you follow all gun safety procedures. Minimize exposure to UV radiation to reduce your risk of skin cancer. What's next? Visit your health care provider once a year for  an annual wellness visit. Ask your health care provider how often you should have your eyes and teeth checked. Stay up to date on all vaccines. This information is not intended to replace advice given to you by your health care provider. Make sure you discuss any questions you have with your health care provider. Document Revised: 06/10/2021 Document Reviewed: 06/10/2021 Elsevier Patient Education  2024 [employer]. Understanding Your Risk for Falls Millions of people have serious injuries from falls each year. It is important to understand your risk of falling. Talk with your health care provider about your risk and what you can do to lower it. If you do have a serious fall, make sure to tell your provider. Falling once raises your risk of falling again. How can falls affect me? Serious injuries from falls are common. These include: Broken bones, such as hip fractures. Head injuries, such as traumatic brain injuries (TBI) or concussions. A fear of falling can cause you to avoid activities and stay at home. This can make your muscles weaker and raise your risk for a fall. What can increase my risk? There are a number of risk factors that increase your risk for falling. The more risk factors you have, the higher your risk of falling. Serious injuries  from a fall happen most often to people who are older than 68 years old. Teenagers and young adults ages 53-29 are also at higher risk. Common risk factors include: Weakness in the lower body. Being generally weak or confused due to long-term (chronic) illness. Dizziness or balance problems. Poor vision. Medicines that cause dizziness or drowsiness. These may include: Medicines for your blood pressure, heart, anxiety, insomnia, or swelling (edema). Pain medicines. Muscle relaxants. Other risk factors include: Drinking alcohol. Having had a fall in the past. Having foot pain or wearing improper footwear. Working at a dangerous job. Having  any of the following in your home: Tripping hazards, such as floor clutter or loose rugs. Poor lighting. Pets. Having dementia or memory loss. What actions can I take to lower my risk of falling?     Physical activity Stay physically fit. Do strength and balance exercises. Consider taking a regular class to build strength and balance. Yoga and tai chi are good options. Vision Have your eyes checked every year and your prescription for glasses or contacts updated as needed. Shoes and walking aids Wear non-skid shoes. Wear shoes that have rubber soles and low heels. Do not wear high heels. Do not walk around the house in socks or slippers. Use a cane or walker as told by your provider. Home safety Attach secure railings on both sides of your stairs. Install grab bars for your bathtub, shower, and toilet. Use a non-skid mat in your bathtub or shower. Attach bath mats securely with double-sided, non-slip rug tape. Use good lighting in all rooms. Keep a flashlight near your bed. Make sure there is a clear path from your bed to the bathroom. Use night-lights. Do not use throw rugs. Make sure all carpeting is taped or tacked down securely. Remove all clutter from walkways and stairways, including extension cords. Repair uneven or broken steps and floors. Avoid walking on icy or slippery surfaces. Walk on the grass instead of on icy or slick sidewalks. Use ice melter to get rid of ice on walkways in the winter. Use a cordless phone. Questions to ask your health care provider Can you help me check my risk for a fall? Do any of my medicines make me more likely to fall? Should I take a vitamin D supplement? What exercises can I do to improve my strength and balance? Should I make an appointment to have my vision checked? Do I need a bone density test to check for weak bones (osteoporosis)? Would it help to use a cane or a walker? Where to find more information Centers for Disease Control  and Prevention, STEAD[employer]: TonerPromos.no Community-Based Fall Prevention Programs: TonerPromos.no [employer] on Aging: BaseRingTones.pl Contact a health care provider if: You fall at home. You are afraid of falling at home. You feel weak, drowsy, or dizzy. This information is not intended to replace advice given to you by your health care provider. Make sure you discuss any questions you have with your health care provider. Document Revised: 08/16/2022 Document Reviewed: 08/16/2022 Elsevier Patient Education  2024 [employer].

## 2023-09-20 ENCOUNTER — Ambulatory Visit: Payer: Medicare Other

## 2023-09-25 ENCOUNTER — Other Ambulatory Visit: Payer: Self-pay | Admitting: Family Medicine

## 2023-09-25 DIAGNOSIS — I10 Essential (primary) hypertension: Secondary | ICD-10-CM

## 2023-09-25 DIAGNOSIS — E119 Type 2 diabetes mellitus without complications: Secondary | ICD-10-CM

## 2023-09-25 DIAGNOSIS — E538 Deficiency of other specified B group vitamins: Secondary | ICD-10-CM

## 2023-09-29 ENCOUNTER — Other Ambulatory Visit (INDEPENDENT_AMBULATORY_CARE_PROVIDER_SITE_OTHER): Payer: Medicare Other

## 2023-09-29 DIAGNOSIS — E119 Type 2 diabetes mellitus without complications: Secondary | ICD-10-CM | POA: Diagnosis not present

## 2023-09-29 DIAGNOSIS — E538 Deficiency of other specified B group vitamins: Secondary | ICD-10-CM | POA: Diagnosis not present

## 2023-09-29 DIAGNOSIS — I10 Essential (primary) hypertension: Secondary | ICD-10-CM | POA: Diagnosis not present

## 2023-09-29 LAB — CBC WITH DIFFERENTIAL/PLATELET
Basophils Absolute: 0.1 10*3/uL (ref 0.0–0.1)
Basophils Relative: 0.7 % (ref 0.0–3.0)
Eosinophils Absolute: 0.5 10*3/uL (ref 0.0–0.7)
Eosinophils Relative: 5.8 % — ABNORMAL HIGH (ref 0.0–5.0)
HCT: 48 % (ref 39.0–52.0)
Hemoglobin: 15.3 g/dL (ref 13.0–17.0)
Lymphocytes Relative: 21.7 % (ref 12.0–46.0)
Lymphs Abs: 1.7 10*3/uL (ref 0.7–4.0)
MCHC: 31.8 g/dL (ref 30.0–36.0)
MCV: 87 fL (ref 78.0–100.0)
Monocytes Absolute: 0.6 10*3/uL (ref 0.1–1.0)
Monocytes Relative: 7.5 % (ref 3.0–12.0)
Neutro Abs: 5.2 10*3/uL (ref 1.4–7.7)
Neutrophils Relative %: 64.3 % (ref 43.0–77.0)
Platelets: 308 10*3/uL (ref 150.0–400.0)
RBC: 5.52 Mil/uL (ref 4.22–5.81)
RDW: 15.2 % (ref 11.5–15.5)
WBC: 8 10*3/uL (ref 4.0–10.5)

## 2023-09-29 LAB — LIPID PANEL
Cholesterol: 177 mg/dL (ref 0–200)
HDL: 40.7 mg/dL (ref >39.00)
LDL Cholesterol: 112 mg/dL — ABNORMAL HIGH (ref 0–99)
NonHDL: 136.13
Total CHOL/HDL Ratio: 4
Triglycerides: 123 mg/dL (ref 0.0–149.0)
VLDL: 24.6 mg/dL (ref 0.0–40.0)

## 2023-09-29 LAB — COMPREHENSIVE METABOLIC PANEL
ALT: 31 U/L (ref 0–53)
AST: 22 U/L (ref 0–37)
Albumin: 4.5 g/dL (ref 3.5–5.2)
Alkaline Phosphatase: 103 U/L (ref 39–117)
BUN: 18 mg/dL (ref 6–23)
CO2: 25 meq/L (ref 19–32)
Calcium: 9.7 mg/dL (ref 8.4–10.5)
Chloride: 102 meq/L (ref 96–112)
Creatinine, Ser: 1.11 mg/dL (ref 0.40–1.50)
GFR: 68.41 mL/min (ref >60.00)
Glucose, Bld: 178 mg/dL — ABNORMAL HIGH (ref 70–99)
Potassium: 4.6 meq/L (ref 3.5–5.1)
Sodium: 135 meq/L (ref 135–145)
Total Bilirubin: 0.5 mg/dL (ref 0.2–1.2)
Total Protein: 7.1 g/dL (ref 6.0–8.3)

## 2023-09-29 LAB — MICROALBUMIN / CREATININE URINE RATIO
Creatinine,U: 153.2 mg/dL
Microalb Creat Ratio: 14.8 mg/g (ref 0.0–30.0)
Microalb, Ur: 22.7 mg/dL — ABNORMAL HIGH (ref 0.0–1.9)

## 2023-09-29 LAB — HEMOGLOBIN A1C: Hgb A1c MFr Bld: 7.2 % — ABNORMAL HIGH (ref 4.6–6.5)

## 2023-09-29 LAB — VITAMIN B12: Vitamin B-12: 434 pg/mL (ref 211–911)

## 2023-10-06 ENCOUNTER — Encounter: Payer: Self-pay | Admitting: Family Medicine

## 2023-10-06 ENCOUNTER — Ambulatory Visit (INDEPENDENT_AMBULATORY_CARE_PROVIDER_SITE_OTHER): Payer: Medicare Other | Admitting: Family Medicine

## 2023-10-06 VITALS — BP 118/84 | HR 94 | Temp 98.5°F | Ht 69.0 in | Wt 276.0 lb

## 2023-10-06 DIAGNOSIS — Z0000 Encounter for general adult medical examination without abnormal findings: Secondary | ICD-10-CM

## 2023-10-06 DIAGNOSIS — Z23 Encounter for immunization: Secondary | ICD-10-CM | POA: Diagnosis not present

## 2023-10-06 DIAGNOSIS — R499 Unspecified voice and resonance disorder: Secondary | ICD-10-CM

## 2023-10-06 DIAGNOSIS — E119 Type 2 diabetes mellitus without complications: Secondary | ICD-10-CM | POA: Diagnosis not present

## 2023-10-06 DIAGNOSIS — M4722 Other spondylosis with radiculopathy, cervical region: Secondary | ICD-10-CM | POA: Diagnosis not present

## 2023-10-06 DIAGNOSIS — I10 Essential (primary) hypertension: Secondary | ICD-10-CM

## 2023-10-06 DIAGNOSIS — Z7189 Other specified counseling: Secondary | ICD-10-CM

## 2023-10-06 DIAGNOSIS — E538 Deficiency of other specified B group vitamins: Secondary | ICD-10-CM | POA: Diagnosis not present

## 2023-10-06 MED ORDER — CYANOCOBALAMIN 1000 MCG/ML IJ SOLN
1000.0000 ug | Freq: Once | INTRAMUSCULAR | Status: AC
Start: 2023-10-06 — End: 2023-10-06
  Administered 2023-10-06: 1000 ug via INTRAMUSCULAR

## 2023-10-06 MED ORDER — MELOXICAM 7.5 MG PO TABS: 7.5000 mg | ORAL_TABLET | Freq: Every day | ORAL | 1 refills | Status: DC

## 2023-10-06 NOTE — Progress Notes (Signed)
Diabetes:  Hypoglycemic episodes: no Hyperglycemic episodes: no Feet problems: he has some changes in sensation that he attributed to his back.   Blood Sugars averaging: not checked.   eye exam within last year: due, d/w pt.   D/w pt about statin indication.    Hypertension:    Using medication without problems or lightheadedness: yes Chest pain with exertion:no Edema:occ minimal edema after prolonged sitting that will then resolve.   Short of breath:no  Flu 2023 Shingles discussed with patient. PNA discussed with patient Tetanus 2016. COVID-vaccine encouraged.   Colon cancer screening declined.  He will consider.  He is aware that he can decrease his risk of death from colon cancer with screening. He can update me as needed.   Prostate cancer screening and PSA options (with potential risks and benefits of testing vs not testing) were discussed along with recent recs/guidelines.  He declined testing PSA at this point. Advance directive-daughter Jodie designated if patient were incapacitated.  B12 def.  Due for dose today.  B12 level wnl.    His wife's situation is not worse but not better. His wife's mood and self esteem are low.  His daughter's divorce will finalize next month per patient report.  D/w pt.   Neuropathy and pain.  R hand paresthesias and pain, some improvement with gabapentin use.  He is still putting up with lower back pain at baseline.  He has tolerated ibuprofen but with limited help.  Discussed options.  Sober for 32 months.  D/w pt.    He noted voice changes and sensation of saliva collection in this throat since neck surgery.  D/w pt about ENT eval and he can consider that.    PMH and SH reviewed.   Vital signs, Meds and allergies reviewed.  ROS: Per HPI unless specifically indicated in ROS section   GEN: nad, alert and oriented HEENT: ncat NECK: supple w/o LA CV: rrr.  PULM: ctab, no inc wob ABD: soft, +bs EXT: no edema SKIN: no acute rash

## 2023-10-06 NOTE — Patient Instructions (Addendum)
Let me know what you want to do for colon cancer screening.  B12 shot today, repeat in 1 month.  Recheck in 3 months.  A1c at the visit.   Call about an eye exam when you can.  Take care.  Glad to see you. Stop ibuprofen and try meloxicam with food.

## 2023-10-09 DIAGNOSIS — R499 Unspecified voice and resonance disorder: Secondary | ICD-10-CM | POA: Insufficient documentation

## 2023-10-09 DIAGNOSIS — Z0000 Encounter for general adult medical examination without abnormal findings: Secondary | ICD-10-CM | POA: Insufficient documentation

## 2023-10-09 NOTE — Assessment & Plan Note (Signed)
Neuropathy and pain.  R hand paresthesias and pain, some improvement with gabapentin use.  He is still putting up with lower back pain at baseline.  He has tolerated ibuprofen but with limited help.  Discussed options.  Stop ibuprofen and change to meloxicam with food.  Routine NSAID cautions discussed with patient.

## 2023-10-09 NOTE — Assessment & Plan Note (Addendum)
D/w pt about statin indication.  History of significant sugar improvement with diet on previous A1c checks.  He will work on diet and we can recheck in 3 months.  A1c discussed with patient.  Defer statin start yet.

## 2023-10-09 NOTE — Assessment & Plan Note (Signed)
Flu 2023 Shingles discussed with patient. PNA discussed with patient Tetanus 2016. COVID-vaccine encouraged.   Colon cancer screening declined.  He will consider.  He is aware that he can decrease his risk of death from colon cancer with screening. He can update me as needed.   Prostate cancer screening and PSA options (with potential risks and benefits of testing vs not testing) were discussed along with recent recs/guidelines.  He declined testing PSA at this point. Advance directive-daughter Jodie designated if patient were incapacitated.

## 2023-10-09 NOTE — Assessment & Plan Note (Signed)
B12 def.  Due for dose today.  B12 level wnl.

## 2023-10-09 NOTE — Assessment & Plan Note (Signed)
Advance directive-daughter Jodie designated if patient were incapacitated.

## 2023-10-09 NOTE — Assessment & Plan Note (Signed)
No stridor. He noted voice changes and sensation of saliva collection in this throat since neck surgery.  D/w pt about ENT eval and he can consider that.  He can update me if he wants to go through with referral.

## 2023-10-09 NOTE — Assessment & Plan Note (Signed)
Continue lisinopril hydrochlorothiazide. ?

## 2023-11-08 ENCOUNTER — Ambulatory Visit (INDEPENDENT_AMBULATORY_CARE_PROVIDER_SITE_OTHER): Payer: Medicare Other

## 2023-11-08 DIAGNOSIS — E538 Deficiency of other specified B group vitamins: Secondary | ICD-10-CM

## 2023-11-08 MED ORDER — CYANOCOBALAMIN 1000 MCG/ML IJ SOLN
1000.0000 ug | Freq: Once | INTRAMUSCULAR | Status: AC
Start: 2023-11-08 — End: 2023-11-08
  Administered 2023-11-08: 1000 ug via INTRAMUSCULAR

## 2023-11-08 NOTE — Progress Notes (Signed)
Per orders of Dr. Graham Duncan, injection of vitamin b 12 given by Jazmon Kos in right deltoid. Patient tolerated injection well. Patient will make appointment for 1 month.   

## 2023-12-13 ENCOUNTER — Ambulatory Visit: Payer: Medicare Other

## 2023-12-13 DIAGNOSIS — E538 Deficiency of other specified B group vitamins: Secondary | ICD-10-CM

## 2023-12-13 MED ORDER — CYANOCOBALAMIN 1000 MCG/ML IJ SOLN
1000.0000 ug | Freq: Once | INTRAMUSCULAR | Status: AC
Start: 2023-12-13 — End: 2023-12-13
  Administered 2023-12-13: 1000 ug via INTRAMUSCULAR

## 2023-12-13 NOTE — Progress Notes (Signed)
Per orders of Dr. Elsie Stain, injection of B-12 given by Francella Solian in left deltoid. Patient tolerated injection well. Patient will make appointment for 1 month.

## 2024-01-06 ENCOUNTER — Encounter: Payer: Self-pay | Admitting: Family Medicine

## 2024-01-06 ENCOUNTER — Ambulatory Visit (INDEPENDENT_AMBULATORY_CARE_PROVIDER_SITE_OTHER): Payer: Medicare Other | Admitting: Family Medicine

## 2024-01-06 VITALS — BP 120/76 | HR 96 | Temp 98.3°F | Ht 69.0 in | Wt 274.8 lb

## 2024-01-06 DIAGNOSIS — E119 Type 2 diabetes mellitus without complications: Secondary | ICD-10-CM | POA: Diagnosis not present

## 2024-01-06 LAB — POCT GLYCOSYLATED HEMOGLOBIN (HGB A1C): Hemoglobin A1C: 6.9 % — AB (ref 4.0–5.6)

## 2024-01-06 MED ORDER — IBUPROFEN 200 MG PO TABS
200.0000 mg | ORAL_TABLET | Freq: Four times a day (QID) | ORAL | Status: DC | PRN
Start: 2024-01-06 — End: 2024-06-05

## 2024-01-06 MED ORDER — ATORVASTATIN CALCIUM 10 MG PO TABS: 10.0000 mg | ORAL_TABLET | Freq: Every day | ORAL | 3 refills | Status: DC

## 2024-01-06 NOTE — Progress Notes (Signed)
 Diabetes:  No meds.  Hypoglycemic episodes: no sx Hyperglycemic episodes: no sx Feet problems: some change in sensation at baseline Blood Sugars averaging: no eye exam within last year: due, d/w pt.   A1c d/w pt.  He is cutting back on carbs, bread and potatoes.   Statin indication d/w pt.  Rx sent.  Statin cautions d/w pt.  His wife has memory/mood changes, d/w pt.   Meloxicam  didn't help joint pain.  Taking ibuprofen  prn for neck pain.  Taking 2 at a time, <8 per day.  Creatinine cautions d/w pt.  Taking 1-2 gabapentin  per day.  Walking with a cane at baseline.   Meds, vitals, and allergies reviewed.  ROS: Per HPI unless specifically indicated in ROS section   GEN: nad, alert and oriented HEENT: ncat NECK: supple w/o LA CV: rrr. PULM: ctab, no inc wob ABD: soft, +bs EXT: no edema SKIN: no acute rash

## 2024-01-06 NOTE — Patient Instructions (Addendum)
 Recheck in about 4-5 months.   A1c at the visit like today.  You don't need to fast.  If you have more aches on atorvastatin 10mg , then stop it and let us know.  Take care.  Glad to see you.

## 2024-01-08 NOTE — Assessment & Plan Note (Signed)
 A1c d/w pt.  He is cutting back on carbs, bread and potatoes.   Statin indication d/w pt.  Rx sent.  Statin cautions d/w pt. Recheck in about 4-5 months.   A1c at the visit.  He doesn't need to fast.

## 2024-01-18 ENCOUNTER — Ambulatory Visit: Payer: Medicare Other

## 2024-01-20 ENCOUNTER — Other Ambulatory Visit: Payer: Self-pay | Admitting: Family Medicine

## 2024-01-20 MED ORDER — GABAPENTIN 300 MG PO CAPS: ORAL_CAPSULE | ORAL | 1 refills | Status: DC

## 2024-01-20 NOTE — Telephone Encounter (Signed)
Last office visit: 01/06/24 Next office visit: 06/05/24 Last refill:  gabapentin (NEURONTIN) 300 MG capsule 05/09/23 90 capsules 1 refill

## 2024-01-20 NOTE — Telephone Encounter (Signed)
Copied from CRM [id number]. Topic: Clinical - Medication Refill >> Jan 20, 2024 10:19 AM Truddie Crumble wrote: Most Recent Primary Care Visit:  Provider: Joaquim Nam  Department: [facility]  Visit Type: OFFICE VISIT  Date: 01/06/2024  Medication: gabapentin  Has the patient contacted their pharmacy? No (Agent: If no, request that the patient contact the pharmacy for the refill. If patient does not wish to contact the pharmacy document the reason why and proceed with request.) (Agent: If yes, when and what did the pharmacy advise?)  Is this the correct pharmacy for this prescription? Yes If no, delete pharmacy and type the correct one.  This is the patient's preferred pharmacy:  [id number] - [address] [place] [address] Phone: [phone] Fax: [phone]   Has the prescription been filled recently? No  Is the patient out of the medication? yes Has the patient been seen for an appointment in the last year OR does the patient have an upcoming appointment? Yes  Can we respond through MyChart? Yes  Agent: Please be advised that Rx refills may take up to 3 business days. We ask that you follow-up with your pharmacy.

## 2024-01-25 ENCOUNTER — Ambulatory Visit (INDEPENDENT_AMBULATORY_CARE_PROVIDER_SITE_OTHER): Payer: Medicare Other

## 2024-01-25 DIAGNOSIS — E538 Deficiency of other specified B group vitamins: Secondary | ICD-10-CM

## 2024-01-25 MED ORDER — CYANOCOBALAMIN 1000 MCG/ML IJ SOLN
1000.0000 ug | Freq: Once | INTRAMUSCULAR | Status: AC
  Administered 2024-01-25: 1000 ug via INTRAMUSCULAR

## 2024-01-25 NOTE — Progress Notes (Signed)
Per orders of Dr. Crawford Givens who is out of office and Dr Eustaquio Boyden who is in office, injection of vitamin b 12 given by Lewanda Rife in right deltoid. Patient tolerated injection well. Patient will make appointment for 1 month.

## 2024-02-28 ENCOUNTER — Ambulatory Visit (INDEPENDENT_AMBULATORY_CARE_PROVIDER_SITE_OTHER): Payer: Medicare Other

## 2024-02-28 DIAGNOSIS — E538 Deficiency of other specified B group vitamins: Secondary | ICD-10-CM

## 2024-02-28 MED ORDER — CYANOCOBALAMIN 1000 MCG/ML IJ SOLN
1000.0000 ug | Freq: Once | INTRAMUSCULAR | Status: AC
  Administered 2024-02-28: 1000 ug via INTRAMUSCULAR

## 2024-02-28 NOTE — Progress Notes (Signed)
 Per orders of Dr. Crawford Givens, injection of vitamin b 12 given by Lewanda Rife in left deltoid. Patient tolerated injection well. Patient will make appointment for 1 month.

## 2024-03-04 ENCOUNTER — Telehealth: Payer: Self-pay | Admitting: Family Medicine

## 2024-03-04 DIAGNOSIS — E119 Type 2 diabetes mellitus without complications: Secondary | ICD-10-CM

## 2024-03-04 NOTE — Telephone Encounter (Signed)
 Please update patient- they will get a letter/mychart message about this.    The hospital system discovered a software error in a system at the laboratory at [facility] at [address] in [place] that examines kidney function. The patient's results were incorrectly calculated on this test.  The test is called uACR. It measures the amount of albumin (a protein) in the urine relative to creatinine (a waste product). This test is one of several factors used to judge kidney function. The laboratory software miscalculated the ratio of one to the other. The measurements themselves were correct. The software error has been fixed.  I went back and checked the patient's lab results and medicines.    At this point, it does not appear that the patient needs to make any medicine changes.  I want to recheck his labs at or before his next visit.  We may need to make changes at that point, based on his results.     If the patient has any questions or concerns about this, please schedule a visit/let me know.   Thanks.

## 2024-03-06 NOTE — Telephone Encounter (Signed)
 Called patient and reviewed all information. Patient verbalized understanding. Patient declined having any questions or concerns at this time.

## 2024-03-29 ENCOUNTER — Ambulatory Visit (INDEPENDENT_AMBULATORY_CARE_PROVIDER_SITE_OTHER)

## 2024-03-29 DIAGNOSIS — E538 Deficiency of other specified B group vitamins: Secondary | ICD-10-CM | POA: Diagnosis not present

## 2024-03-29 MED ORDER — CYANOCOBALAMIN 1000 MCG/ML IJ SOLN
1000.0000 ug | Freq: Once | INTRAMUSCULAR | Status: AC
  Administered 2024-03-29: 1000 ug via INTRAMUSCULAR

## 2024-03-29 NOTE — Progress Notes (Signed)
 Per orders of Dr. Crawford Givens, injection of vitamin b 12 given by Lewanda Rife in left deltoid at pts request. Eric Nixon tolerated injection well. Eric Nixon will make appointment for 1 month.

## 2024-04-01 ENCOUNTER — Other Ambulatory Visit: Payer: Self-pay | Admitting: Family Medicine

## 2024-05-01 ENCOUNTER — Ambulatory Visit

## 2024-05-01 DIAGNOSIS — E538 Deficiency of other specified B group vitamins: Secondary | ICD-10-CM | POA: Diagnosis not present

## 2024-05-01 MED ORDER — CYANOCOBALAMIN 1000 MCG/ML IJ SOLN
1000.0000 ug | Freq: Once | INTRAMUSCULAR | Status: AC
  Administered 2024-05-01: 1000 ug via INTRAMUSCULAR

## 2024-05-01 NOTE — Progress Notes (Signed)
 Per orders of Dr. Crawford Givens, injection of vitamin b 12 given by Lewanda Rife in right deltoid. Patient tolerated injection well. Patient will make appointment for 1 month.

## 2024-06-05 ENCOUNTER — Ambulatory Visit (INDEPENDENT_AMBULATORY_CARE_PROVIDER_SITE_OTHER): Payer: Medicare Other | Admitting: Family Medicine

## 2024-06-05 ENCOUNTER — Ambulatory Visit (INDEPENDENT_AMBULATORY_CARE_PROVIDER_SITE_OTHER)

## 2024-06-05 ENCOUNTER — Encounter: Payer: Self-pay | Admitting: Family Medicine

## 2024-06-05 VITALS — BP 122/74 | HR 73 | Temp 98.1°F | Ht 69.0 in | Wt 275.6 lb

## 2024-06-05 DIAGNOSIS — E119 Type 2 diabetes mellitus without complications: Secondary | ICD-10-CM

## 2024-06-05 DIAGNOSIS — E538 Deficiency of other specified B group vitamins: Secondary | ICD-10-CM | POA: Diagnosis not present

## 2024-06-05 DIAGNOSIS — M2550 Pain in unspecified joint: Secondary | ICD-10-CM | POA: Diagnosis not present

## 2024-06-05 DIAGNOSIS — Z7984 Long term (current) use of oral hypoglycemic drugs: Secondary | ICD-10-CM

## 2024-06-05 LAB — POCT GLYCOSYLATED HEMOGLOBIN (HGB A1C): Hemoglobin A1C: 7 % — AB (ref 4.0–5.6)

## 2024-06-05 LAB — BASIC METABOLIC PANEL WITH GFR
BUN: 19 mg/dL (ref 6–23)
CO2: 28 meq/L (ref 19–32)
Calcium: 9.8 mg/dL (ref 8.4–10.5)
Chloride: 100 meq/L (ref 96–112)
Creatinine, Ser: 1.01 mg/dL (ref 0.40–1.50)
GFR: 76.25 mL/min (ref >60.00)
Glucose, Bld: 150 mg/dL — ABNORMAL HIGH (ref 70–99)
Potassium: 4.3 meq/L (ref 3.5–5.1)
Sodium: 135 meq/L (ref 135–145)

## 2024-06-05 MED ORDER — CYANOCOBALAMIN 1000 MCG/ML IJ SOLN
1000.0000 ug | Freq: Once | INTRAMUSCULAR | Status: AC
  Administered 2024-06-05: 1000 ug via INTRAMUSCULAR

## 2024-06-05 NOTE — Patient Instructions (Addendum)
 Go to the lab on the way out.   If you have mychart we'll likely use that to update you.    Take care.  Glad to see you.

## 2024-06-05 NOTE — Progress Notes (Signed)
 Per orders of Dr. Richrd Char, injection of vitamn b 12 given by Claretha Crocker in left deltoid. Patient tolerated injection well. Patient will make appointment for 1 month.

## 2024-06-05 NOTE — Progress Notes (Unsigned)
 Diabetes:  No meds.  Hypoglycemic episodes: no sx Hyperglycemic episodes: no sx Feet problems: no new sx, prev attributed to his back.   Blood Sugars averaging: not checked.   eye exam within last year: due, d/w pt.   A1c 7.  D/w pt.  Nsaid cautions d/w pt.  He had taken nsaids in the past.  Stopped ibuprofen , restarted meloxicam . Recheck bmet pending.  Malb pending.   His wife's memory is getting worse per patient report.  She can still do some ADLs but she needs more supervision.  She has f/u scheduled for 06/2024.  D/w pt.    Meds, vitals, and allergies reviewed.   ROS: Per HPI unless specifically indicated in ROS section   GEN: nad, alert and oriented HEENT: ncat NECK: supple w/o LA CV: rrr. PULM: ctab, no inc wob ABD: soft, +bs EXT: no edema SKIN: well perfused.   Diabetic foot exam: Normal inspection No skin breakdown No calluses  Normal DP pulses Normal sensation to light touch and monofilament Nails normal

## 2024-06-06 ENCOUNTER — Encounter: Payer: Self-pay | Admitting: Family Medicine

## 2024-06-06 ENCOUNTER — Ambulatory Visit: Payer: Self-pay | Admitting: Family Medicine

## 2024-06-06 ENCOUNTER — Other Ambulatory Visit: Payer: Self-pay | Admitting: Radiology

## 2024-06-06 ENCOUNTER — Other Ambulatory Visit: Payer: Self-pay | Admitting: Family Medicine

## 2024-06-06 DIAGNOSIS — E1129 Type 2 diabetes mellitus with other diabetic kidney complication: Secondary | ICD-10-CM

## 2024-06-06 DIAGNOSIS — E119 Type 2 diabetes mellitus without complications: Secondary | ICD-10-CM

## 2024-06-06 LAB — MICROALBUMIN / CREATININE URINE RATIO
Creatinine,U: 141.8 mg/dL
Microalb Creat Ratio: 209.7 mg/g — ABNORMAL HIGH (ref 0.0–30.0)
Microalb, Ur: 29.7 mg/dL — ABNORMAL HIGH (ref 0.0–1.9)

## 2024-06-06 MED ORDER — EMPAGLIFLOZIN 10 MG PO TABS: 10.0000 mg | ORAL_TABLET | Freq: Every day | ORAL | 3 refills | Status: AC

## 2024-06-06 NOTE — Assessment & Plan Note (Signed)
 See notes on labs.  No change in meds now.   Bmet and malb pending.  Recheck periodically.

## 2024-06-06 NOTE — Assessment & Plan Note (Signed)
 See notes from creatinine regarding meloxicam  use.

## 2024-06-09 ENCOUNTER — Other Ambulatory Visit: Payer: Self-pay | Admitting: Family Medicine

## 2024-07-25 ENCOUNTER — Ambulatory Visit (INDEPENDENT_AMBULATORY_CARE_PROVIDER_SITE_OTHER)

## 2024-07-25 DIAGNOSIS — E538 Deficiency of other specified B group vitamins: Secondary | ICD-10-CM

## 2024-07-25 MED ORDER — CYANOCOBALAMIN 1000 MCG/ML IJ SOLN
1000.0000 ug | Freq: Once | INTRAMUSCULAR | Status: AC
  Administered 2024-07-25: 1000 ug via INTRAMUSCULAR

## 2024-07-25 NOTE — Progress Notes (Signed)
Per orders of Dr. Loleta Books is out of office and Dr Eustaquio Boyden who is in the office injection of vitamin b 12 given by Lewanda Rife in right deltoid.Patient tolerated injection well. Patient will make appointment for 1 month.

## 2024-08-17 ENCOUNTER — Other Ambulatory Visit: Payer: Self-pay | Admitting: Family Medicine

## 2024-08-28 ENCOUNTER — Ambulatory Visit

## 2024-08-28 DIAGNOSIS — E538 Deficiency of other specified B group vitamins: Secondary | ICD-10-CM | POA: Diagnosis not present

## 2024-08-28 MED ORDER — CYANOCOBALAMIN 1000 MCG/ML IJ SOLN
1000.0000 ug | Freq: Once | INTRAMUSCULAR | Status: AC
  Administered 2024-08-28: 1000 ug via INTRAMUSCULAR

## 2024-08-28 NOTE — Progress Notes (Addendum)
 Per orders of Dr. Crawford Givens, injection of B-12 given by Nanci Pina in left deltoid. Patient tolerated injection well.

## 2024-09-27 ENCOUNTER — Ambulatory Visit (INDEPENDENT_AMBULATORY_CARE_PROVIDER_SITE_OTHER)

## 2024-09-27 ENCOUNTER — Other Ambulatory Visit

## 2024-09-27 ENCOUNTER — Ambulatory Visit: Payer: Self-pay | Admitting: Family Medicine

## 2024-09-27 DIAGNOSIS — R809 Proteinuria, unspecified: Secondary | ICD-10-CM

## 2024-09-27 DIAGNOSIS — E538 Deficiency of other specified B group vitamins: Secondary | ICD-10-CM | POA: Diagnosis not present

## 2024-09-27 DIAGNOSIS — E1129 Type 2 diabetes mellitus with other diabetic kidney complication: Secondary | ICD-10-CM

## 2024-09-27 LAB — CBC WITH DIFFERENTIAL/PLATELET
Basophils Absolute: 0 K/uL (ref 0.0–0.1)
Basophils Relative: 0.6 % (ref 0.0–3.0)
Eosinophils Absolute: 0.3 K/uL (ref 0.0–0.7)
Eosinophils Relative: 3.1 % (ref 0.0–5.0)
HCT: 48 % (ref 39.0–52.0)
Hemoglobin: 15.9 g/dL (ref 13.0–17.0)
Lymphocytes Relative: 19.8 % (ref 12.0–46.0)
Lymphs Abs: 1.6 K/uL (ref 0.7–4.0)
MCHC: 33.1 g/dL (ref 30.0–36.0)
MCV: 86.7 fl (ref 78.0–100.0)
Monocytes Absolute: 0.6 K/uL (ref 0.1–1.0)
Monocytes Relative: 7.7 % (ref 3.0–12.0)
Neutro Abs: 5.5 K/uL (ref 1.4–7.7)
Neutrophils Relative %: 68.8 % (ref 43.0–77.0)
Platelets: 292 K/uL (ref 150.0–400.0)
RBC: 5.54 Mil/uL (ref 4.22–5.81)
RDW: 14.2 % (ref 11.5–15.5)
WBC: 8.1 K/uL (ref 4.0–10.5)

## 2024-09-27 LAB — LIPID PANEL
Cholesterol: 153 mg/dL (ref 0–200)
HDL: 39.7 mg/dL (ref >39.00)
LDL Cholesterol: 89 mg/dL (ref 0–99)
NonHDL: 113.12
Total CHOL/HDL Ratio: 4
Triglycerides: 120 mg/dL (ref 0.0–149.0)
VLDL: 24 mg/dL (ref 0.0–40.0)

## 2024-09-27 LAB — VITAMIN B12: Vitamin B-12: 507 pg/mL (ref 211–911)

## 2024-09-27 LAB — HEMOGLOBIN A1C: Hgb A1c MFr Bld: 7.1 % — ABNORMAL HIGH (ref 4.6–6.5)

## 2024-09-27 LAB — COMPREHENSIVE METABOLIC PANEL WITH GFR
ALT: 28 U/L (ref 0–53)
AST: 21 U/L (ref 0–37)
Albumin: 4.6 g/dL (ref 3.5–5.2)
Alkaline Phosphatase: 81 U/L (ref 39–117)
BUN: 25 mg/dL — ABNORMAL HIGH (ref 6–23)
CO2: 24 meq/L (ref 19–32)
Calcium: 9.8 mg/dL (ref 8.4–10.5)
Chloride: 102 meq/L (ref 96–112)
Creatinine, Ser: 1.22 mg/dL (ref 0.40–1.50)
GFR: 60.65 mL/min (ref >60.00)
Glucose, Bld: 160 mg/dL — ABNORMAL HIGH (ref 70–99)
Potassium: 4.3 meq/L (ref 3.5–5.1)
Sodium: 135 meq/L (ref 135–145)
Total Bilirubin: 0.6 mg/dL (ref 0.2–1.2)
Total Protein: 7.7 g/dL (ref 6.0–8.3)

## 2024-09-27 LAB — MICROALBUMIN / CREATININE URINE RATIO
Creatinine,U: 79.8 mg/dL
Microalb Creat Ratio: 122.4 mg/g — ABNORMAL HIGH (ref 0.0–30.0)
Microalb, Ur: 9.8 mg/dL — ABNORMAL HIGH (ref 0.0–1.9)

## 2024-09-27 MED ORDER — CYANOCOBALAMIN 1000 MCG/ML IJ SOLN
1000.0000 ug | Freq: Once | INTRAMUSCULAR | Status: AC
  Administered 2024-09-27: 1000 ug via INTRAMUSCULAR

## 2024-09-27 NOTE — Progress Notes (Addendum)
 Per orders of Dr. Arlyss Cleatus lovely is out of office and Dr Laine Balls who is in office injection of vitamin b 12 given by Laray Arenas in Right deltoid. Patient tolerated injection well. Patient will make appointment for 1 month.

## 2024-09-29 ENCOUNTER — Other Ambulatory Visit: Payer: Self-pay | Admitting: Family Medicine

## 2024-09-29 DIAGNOSIS — R202 Paresthesia of skin: Secondary | ICD-10-CM

## 2024-10-01 NOTE — Telephone Encounter (Signed)
 Gabapentin  Last filled:  08/31/24, #90 Last OV:  06/05/24, 5 mo DM f/u Next OV:  10/04/24, annual exam

## 2024-10-04 ENCOUNTER — Ambulatory Visit (INDEPENDENT_AMBULATORY_CARE_PROVIDER_SITE_OTHER): Admitting: Family Medicine

## 2024-10-04 ENCOUNTER — Encounter: Payer: Self-pay | Admitting: Family Medicine

## 2024-10-04 VITALS — BP 128/80 | HR 88 | Temp 97.8°F | Ht 68.31 in | Wt 270.8 lb

## 2024-10-04 DIAGNOSIS — Z23 Encounter for immunization: Secondary | ICD-10-CM

## 2024-10-04 DIAGNOSIS — I10 Essential (primary) hypertension: Secondary | ICD-10-CM

## 2024-10-04 DIAGNOSIS — M2550 Pain in unspecified joint: Secondary | ICD-10-CM

## 2024-10-04 DIAGNOSIS — E538 Deficiency of other specified B group vitamins: Secondary | ICD-10-CM

## 2024-10-04 DIAGNOSIS — Z7189 Other specified counseling: Secondary | ICD-10-CM

## 2024-10-04 DIAGNOSIS — E785 Hyperlipidemia, unspecified: Secondary | ICD-10-CM | POA: Diagnosis not present

## 2024-10-04 DIAGNOSIS — R202 Paresthesia of skin: Secondary | ICD-10-CM

## 2024-10-04 DIAGNOSIS — Z0000 Encounter for general adult medical examination without abnormal findings: Secondary | ICD-10-CM

## 2024-10-04 DIAGNOSIS — R809 Proteinuria, unspecified: Secondary | ICD-10-CM

## 2024-10-04 DIAGNOSIS — E1129 Type 2 diabetes mellitus with other diabetic kidney complication: Secondary | ICD-10-CM | POA: Diagnosis not present

## 2024-10-04 MED ORDER — ATORVASTATIN CALCIUM 10 MG PO TABS: 10.0000 mg | ORAL_TABLET | Freq: Every day | ORAL | 3 refills | Status: AC

## 2024-10-04 MED ORDER — LISINOPRIL-HYDROCHLOROTHIAZIDE 20-12.5 MG PO TABS
1.0000 | ORAL_TABLET | Freq: Every day | ORAL | 3 refills | Status: AC
Start: 2024-10-04 — End: ?

## 2024-10-04 MED ORDER — GABAPENTIN 300 MG PO CAPS: ORAL_CAPSULE | ORAL | 5 refills | Status: AC

## 2024-10-04 NOTE — Patient Instructions (Signed)
 I would get a flu shot each fall.   Take care.  Glad to see you. Recheck in about 4 months.  Labs ahead of time.

## 2024-10-04 NOTE — Progress Notes (Signed)
 His wife has progressive memory changes, d/w pt.   Flu d/w pt. Done 2025.  Shingles discussed with patient. PNA discussed with patient Tetanus 2016. COVID-vaccine encouraged.   Colon cancer screening declined.  He will consider.  He is aware that he can decrease his risk of death from colon cancer with screening. He can update me as needed.   Prostate cancer screening and PSA options (with potential risks and benefits of testing vs not testing) were discussed along with recent recs/guidelines.  He declined testing PSA at this point. Advance directive-daughter Jodie designated if patient were incapacitated.  H/o B12 def, most recent level wnl.  Labs discussed.  Diabetes:  Using medications without difficulties: yes except for inc urination with jardiance .  Hypoglycemic episodes:no sx except for prolonged fasting. Cautions dw pt.   Hyperglycemic episodes:no sx Feet problems: h/o neuropathy at baseline.  Blood Sugars averaging: not checked.  eye exam within last year: due, d/w pt.   Labs d/w pt.   MALB abnormal but improved.   Hypertension:    Using medication without problems or lightheadedness: yes Chest pain with exertion:no Edema:occ trace edema at end of the day.   Short of breath:no  Elevated Cholesterol: Using medications without problems: yes Muscle aches: not likely from statin.  Diet compliance:d/w pt.  Exercise: d/w pt.   Joint pain.  He isn't taking meloxicam .  Taking gabapentin  at night, after work.  D/w pt about using gabapentin  during the day.    PMH and SH reviewed.   Vital signs, Meds and allergies reviewed.  ROS: Per HPI unless specifically indicated in ROS section   GEN: nad, alert and oriented HEENT: ncat NECK: supple w/o LA CV: rrr PULM: ctab, no inc wob ABD: soft, +bs EXT: no edema SKIN: Well-perfused.  Diabetic foot exam: Normal inspection No skin breakdown No calluses  Normal DP pulses Normal sensation to light tough and  monofilament Nails normal

## 2024-10-07 DIAGNOSIS — E785 Hyperlipidemia, unspecified: Secondary | ICD-10-CM | POA: Insufficient documentation

## 2024-10-07 NOTE — Assessment & Plan Note (Signed)
 Continue work on diet and exercise.  Continue lisinopril  hydrochlorothiazide .

## 2024-10-07 NOTE — Assessment & Plan Note (Signed)
Continue replacement as is. 

## 2024-10-07 NOTE — Assessment & Plan Note (Signed)
  Advance directive-daughter Jodie designated if patient were incapacitated.

## 2024-10-07 NOTE — Assessment & Plan Note (Signed)
 He isn't taking meloxicam .  Taking gabapentin  at night, after work.  D/w pt about using gabapentin  during the day.

## 2024-10-07 NOTE — Assessment & Plan Note (Signed)
Continue work on diet and exercise.  Continue atorvastatin. 

## 2024-10-07 NOTE — Assessment & Plan Note (Signed)
 Labs d/w pt.   MALB abnormal but improved.  No change in medications at this point.  Continue Jardiance .  Already on ACE and statin. Recheck in about 4 months.  Labs ahead of time.

## 2024-10-07 NOTE — Assessment & Plan Note (Signed)
 Flu d/w pt. Done 2025.  Shingles discussed with patient. PNA discussed with patient Tetanus 2016. COVID-vaccine encouraged.   Colon cancer screening declined.  He will consider.  He is aware that he can decrease his risk of death from colon cancer with screening. He can update me as needed.   Prostate cancer screening and PSA options (with potential risks and benefits of testing vs not testing) were discussed along with recent recs/guidelines.  He declined testing PSA at this point. Advance directive-daughter Jodie designated if patient were incapacitated.

## 2024-10-30 ENCOUNTER — Ambulatory Visit

## 2024-10-30 DIAGNOSIS — E538 Deficiency of other specified B group vitamins: Secondary | ICD-10-CM | POA: Diagnosis not present

## 2024-10-30 MED ORDER — CYANOCOBALAMIN 1000 MCG/ML IJ SOLN
1000.0000 ug | Freq: Once | INTRAMUSCULAR | Status: AC
  Administered 2024-10-30: 1000 ug via INTRAMUSCULAR

## 2024-10-30 NOTE — Progress Notes (Signed)
 Per orders of Dr. Arlyss Solian, injection of vitamin b 12 given by Laray Arenas in right deltoid. Pt request. Patient tolerated injection well. Patient will make appointment for 1 month.

## 2024-11-29 ENCOUNTER — Ambulatory Visit

## 2024-11-29 ENCOUNTER — Telehealth: Payer: Self-pay | Admitting: Family Medicine

## 2024-11-29 DIAGNOSIS — E538 Deficiency of other specified B group vitamins: Secondary | ICD-10-CM | POA: Diagnosis not present

## 2024-11-29 MED ORDER — CYANOCOBALAMIN 1000 MCG/ML IJ SOLN
1000.0000 ug | Freq: Once | INTRAMUSCULAR | Status: AC
  Administered 2024-11-29: 1000 ug via INTRAMUSCULAR

## 2024-11-29 NOTE — Telephone Encounter (Signed)
 Error see med request in wifes chart same day

## 2024-11-29 NOTE — Progress Notes (Signed)
 Per orders of Dr. Crawford Givens, injection of vitamin b 12 given by Lewanda Rife in left deltoid. Patient tolerated injection well. Patient will make appointment for 1 month.

## 2025-01-02 ENCOUNTER — Ambulatory Visit

## 2025-01-02 DIAGNOSIS — E538 Deficiency of other specified B group vitamins: Secondary | ICD-10-CM | POA: Diagnosis not present

## 2025-01-02 MED ORDER — CYANOCOBALAMIN 1000 MCG/ML IJ SOLN
1000.0000 ug | Freq: Once | INTRAMUSCULAR | Status: AC
  Administered 2025-01-02: 1000 ug via INTRAMUSCULAR

## 2025-01-02 NOTE — Progress Notes (Signed)
 Per orders of Dr. Arlyss Solian, who is out of office and Dr Anton Blas injection of vitamin b 12 given by Laray Arenas in right deltoid. Patient tolerated injection well. Patient will make appointment for 1 month.

## 2025-02-05 ENCOUNTER — Ambulatory Visit

## 2025-02-19 ENCOUNTER — Ambulatory Visit
